# Patient Record
Sex: Female | Born: 1985 | Race: White | Hispanic: No | Marital: Married | Smoking: Never smoker
Health system: Southern US, Community
[De-identification: ages and names within clinical notes are randomized; demographics above are authoritative.]

## PROBLEM LIST (undated history)

## (undated) DIAGNOSIS — IMO0001 Reserved for inherently not codable concepts without codable children: Secondary | ICD-10-CM

## (undated) DIAGNOSIS — R519 Headache, unspecified: Secondary | ICD-10-CM

## (undated) DIAGNOSIS — F419 Anxiety disorder, unspecified: Secondary | ICD-10-CM

## (undated) DIAGNOSIS — R87629 Unspecified abnormal cytological findings in specimens from vagina: Secondary | ICD-10-CM

## (undated) DIAGNOSIS — R51 Headache: Secondary | ICD-10-CM

## (undated) HISTORY — DX: Headache: R51

## (undated) HISTORY — DX: Unspecified abnormal cytological findings in specimens from vagina: R87.629

## (undated) HISTORY — DX: Anxiety disorder, unspecified: F41.9

## (undated) HISTORY — DX: Headache, unspecified: R51.9

## (undated) HISTORY — DX: Reserved for inherently not codable concepts without codable children: IMO0001

---

## 2007-02-02 ENCOUNTER — Other Ambulatory Visit: Payer: Self-pay

## 2007-02-02 ENCOUNTER — Emergency Department: Payer: Self-pay

## 2007-10-18 ENCOUNTER — Ambulatory Visit: Payer: Self-pay | Admitting: Obstetrics and Gynecology

## 2012-03-31 ENCOUNTER — Encounter: Payer: Self-pay | Admitting: Internal Medicine

## 2012-03-31 ENCOUNTER — Ambulatory Visit (INDEPENDENT_AMBULATORY_CARE_PROVIDER_SITE_OTHER): Payer: 59 | Admitting: Internal Medicine

## 2012-03-31 VITALS — BP 110/68 | HR 68 | Temp 98.9°F | Resp 16 | Ht 66.0 in | Wt 192.5 lb

## 2012-03-31 DIAGNOSIS — R21 Rash and other nonspecific skin eruption: Secondary | ICD-10-CM

## 2012-03-31 DIAGNOSIS — L564 Polymorphous light eruption: Secondary | ICD-10-CM | POA: Insufficient documentation

## 2012-03-31 DIAGNOSIS — L568 Other specified acute skin changes due to ultraviolet radiation: Secondary | ICD-10-CM

## 2012-03-31 MED ORDER — ETONOGESTREL-ETHINYL ESTRADIOL 0.12-0.015 MG/24HR VA RING: VAGINAL_RING | VAGINAL | Status: DC

## 2012-03-31 MED ORDER — BETAMETHASONE DIPROPIONATE AUG 0.05 % EX CREA: TOPICAL_CREAM | Freq: Two times a day (BID) | CUTANEOUS | Status: DC

## 2012-03-31 NOTE — Patient Instructions (Addendum)
Your rash appears to be a polymorphic light eruption due to sun exposure.  I have given you a steroid injection and am prescribing a high potency steroid cream to apply twice daily for the next week.

## 2012-03-31 NOTE — Progress Notes (Signed)
Patient ID: Kendra Ramos, female   DOB: [birth year], 26 y.o.   MRN: [id number] Patient Active Problem List  Diagnosis  . Polymorphic light eruption    Subjective:  CC:   Chief Complaint  Patient presents with  . Rash    HPI:   Kendra Ramos is a 26 y.o. female who presents as a new patient to establish primary care with the chief complaint of   Pruritic skin rash affecting arm and leg on right side of body as well as entire upper back.  The rash has been present for about one to 2 weeks it has progressed. She states that the rash startsoff with pink papules ,  which become intensely pruritic buteventually resolve leaving hypopigmented areas.  She has been taking benadryl and tried an Metallurgist /colloidal bath last night for the intense itching. No significant change.  Has not changed detergents,  Moisturizers but has started  A supplement containing  Magnolia and phelodendron called Relora.  She has regular signs blood her..  Had  2 full days of right sided sun exposure last weekend and a prior trip to the beach the first week of August.  She has also used a tanning bed but not in the past 2 weeks.    she was recently evaluated urgent care for right foot was found to be dehydrated after working out at gym.  seen by Urgent Care and cmet cbc drawn.     History reviewed. No pertinent past medical history.  History reviewed. No pertinent past surgical history.  Family History  Problem Relation Age of Onset  . Diabetes Mother   . Cancer Mother     skin ca  . Diabetes Father   . Diabetes Maternal Grandmother   . Diabetes Maternal Grandfather   . Diabetes Paternal Grandmother   . Diabetes Paternal Grandfather     History   Social History  . Marital Status: Married    Spouse Name: N/A    Number of Children: N/A  . Years of Education: N/A   Occupational History  . Not on file.   Social History Main Topics  . Smoking status: Never Smoker   . Smokeless tobacco: Never Used  .  Alcohol Use: 3.5 oz/week    7 drink(s) per week  . Drug Use: No  . Sexually Active: Not on file   Other Topics Concern  . Not on file   Social History Narrative  . No narrative on file   No Known Allergies    Review of Systems:   The remainder of the review of systems was negative except those addressed in the HPI.     Objective:  BP 110/68  Pulse 68  Temp 98.9 F (37.2 C) (Oral)  Resp 16  Ht 5\' 6"  (1.676 m)  Wt 192 lb 8 oz (87.317 kg)  BMI 31.07 kg/m2  SpO2 98%  LMP 02/29/2012  General appearance: alert, cooperative and appears stated age Ears: normal TM's and external ear canals both ears Throat: lips, mucosa, and tongue normal; teeth and gums normal Neck: no adenopathy, no carotid bruit, supple, symmetrical, trachea midline and thyroid not enlarged, symmetric, no tenderness/mass/nodules Back: symmetric, no curvature. ROM normal. No CVA tenderness. Lungs: clear to auscultation bilaterally Heart: regular rate and rhythm, S1, S2 normal, no murmur, click, rub or gallop Abdomen: soft, non-tender; bowel sounds normal; no masses,  no organomegaly Pulses: 2+ and symmetric Skin: Skin color, texture, turgor normal. No rashes or lesions Lymph nodes: Cervical, supraclavicular, and  axillary nodes normal.  Assessment and Plan:  Polymorphic light eruption Versus drug reaction to several supplements that she has been taking. However no prior workup for autoimmune/rheumatologic dose disorders has been done. Serologies were sent off today. I recommended that she continue to suspend her supplements and try a steroid taper which was started today with a intramuscular injection of Depo-Medrol. If symptoms improve and resolve she can use resume her supplements for the time to see if symptoms return.   Updated Medication List Outpatient Encounter Prescriptions as of 03/31/2012  Medication Sig Dispense Refill  . Ascorbic Acid (VITAMIN C) 1000 MG tablet Take 2,000 mg by mouth daily.       Marland Kitchen augmented betamethasone dipropionate (DIPROLENE AF) 0.05 % cream Apply topically 2 (two) times daily.  50 g  1  . Biotin 10 MG CAPS Take by mouth.      . etonogestrel-ethinyl estradiol (NUVARING) 0.12-0.015 MG/24HR vaginal ring Insert vaginally and leave in place for 3 consecutive weeks, then remove for 1 week.  1 each  12  . fish oil-omega-3 fatty acids 1000 MG capsule Take 2 g by mouth daily.      Chilton Si Coffee Bean-Yerba Mate (GREEN COFFEE BEAN EXTRACT PO) Take by mouth.      . loratadine (CLARITIN) 10 MG tablet Take 10 mg by mouth daily.      . NON FORMULARY Relora      . vitamin B-12 (CYANOCOBALAMIN) 100 MCG tablet Take 50 mcg by mouth daily.         Orders Placed This Encounter  Procedures  . Sedimentation rate  . C-reactive protein  . ANA    No Follow-up on file.

## 2012-04-02 NOTE — Assessment & Plan Note (Signed)
Versus drug reaction to several supplements that she has been taking. I recommended that she continue to suspend her supplements and try a steroid taper which was started today with a intramuscular injection of Depo-Medrol. If symptoms improve and resolve she can use resume her supplements for the time to see if symptoms return.

## 2012-06-08 ENCOUNTER — Other Ambulatory Visit: Payer: Self-pay | Admitting: Internal Medicine

## 2012-06-08 DIAGNOSIS — M7750 Other enthesopathy of unspecified foot: Secondary | ICD-10-CM

## 2012-08-30 ENCOUNTER — Other Ambulatory Visit: Payer: Self-pay | Admitting: Internal Medicine

## 2012-08-30 MED ORDER — METHOCARBAMOL 500 MG PO TABS: 500.0000 mg | ORAL_TABLET | Freq: Four times a day (QID) | ORAL | Status: DC

## 2012-08-30 MED ORDER — TRAMADOL HCL 50 MG PO TABS
50.0000 mg | ORAL_TABLET | Freq: Three times a day (TID) | ORAL | Status: DC | PRN
Start: 2012-08-30 — End: 2012-09-29

## 2012-09-18 ENCOUNTER — Other Ambulatory Visit: Payer: Self-pay | Admitting: Internal Medicine

## 2012-09-22 ENCOUNTER — Other Ambulatory Visit: Payer: Self-pay | Admitting: Internal Medicine

## 2012-09-22 MED ORDER — HYDROCODONE-HOMATROPINE 5-1.5 MG/5ML PO SYRP: 5.0000 mL | ORAL_SOLUTION | ORAL | Status: DC | PRN

## 2012-09-29 ENCOUNTER — Ambulatory Visit (INDEPENDENT_AMBULATORY_CARE_PROVIDER_SITE_OTHER): Payer: 59 | Admitting: Internal Medicine

## 2012-09-29 ENCOUNTER — Encounter: Payer: Self-pay | Admitting: Internal Medicine

## 2012-09-29 VITALS — BP 110/70 | HR 84 | Temp 98.5°F | Resp 12 | Ht 66.0 in | Wt 203.0 lb

## 2012-09-29 DIAGNOSIS — J209 Acute bronchitis, unspecified: Secondary | ICD-10-CM | POA: Insufficient documentation

## 2012-09-29 MED ORDER — AZITHROMYCIN 500 MG PO TABS: 500.0000 mg | ORAL_TABLET | Freq: Every day | ORAL | Status: DC

## 2012-09-29 MED ORDER — HYDROCODONE-HOMATROPINE 5-1.5 MG/5ML PO SYRP: 5.0000 mL | ORAL_SOLUTION | Freq: Four times a day (QID) | ORAL | Status: DC | PRN

## 2012-09-29 MED ORDER — PREDNISONE (PAK) 10 MG PO TABS: ORAL_TABLET | ORAL | Status: DC

## 2012-09-29 NOTE — Patient Instructions (Addendum)
Will send antibiotic once I see your strep test ,   Pertussis test to be drawn today at [facility] the prednisone with the antibiotic ( 6 days pre prednisone,  7 of abx)  If strep is positive,  You will need to be out of work for 48 hrs

## 2012-10-01 NOTE — Progress Notes (Signed)
Patient ID: Kendra Ramos, female   DOB: 1985/09/13, 27 y.o.   MRN: [id number]   Patient Active Problem List  Diagnosis  . Polymorphic light eruption  . Acute bronchitis    Subjective:  CC:   Chief Complaint  Patient presents with  . Sinusitis    HPI:   Kendra Ramos a 27 y.o. female who presents Sinus pressure,  purulent drainage accompanied by laryngitis and persistent cough. Patient's symptoms started approximately 10 days ago with a sore throat low-grade fevers and congestion. She has no history of high fevers or myalgias and had had her flu shot. She will was instructed to treat her symptoms with decongestants and prescribed a cough medicine. She went on a cruise that have been previous as scheduled and states that the symptoms progressed and she was very uncomfortable and a cruise with continued cough and sinus congestion and lost her voice about 3 days ago.   No past medical history on file.  No past surgical history on file.     The following portions of the patient's history were reviewed and updated as appropriate: Allergies, current medications, and problem list.    Review of Systems:   12 Pt  review of systems was negative except those addressed in the HPI,     History   Social History  . Marital Status: Married    Spouse Name: N/A    Number of Children: N/A  . Years of Education: N/A   Occupational History  . Not on file.   Social History Main Topics  . Smoking status: Never Smoker   . Smokeless tobacco: Never Used  . Alcohol Use: 3.5 oz/week    7 drink(s) per week  . Drug Use: No  . Sexually Active: Not on file   Other Topics Concern  . Not on file   Social History Narrative  . No narrative on file    Objective:  BP 110/70  Pulse 84  Temp(Src) 98.5 F (36.9 C) (Oral)  Resp 12  Ht 5\' 6"  (1.676 m)  Wt 203 lb (92.08 kg)  BMI 32.78 kg/m2  General appearance: alert, cooperative and appears stated age Ears: normal TM's and  external ear canals both ears Throat: lips, mucosa, and tongue normal; teeth and gums normal Neck: no adenopathy, no carotid bruit, supple, symmetrical, trachea midline and thyroid not enlarged, symmetric, no tenderness/mass/nodules Back: symmetric, no curvature. ROM normal. No CVA tenderness. Lungs: clear to auscultation bilaterally Heart: regular rate and rhythm, S1, S2 normal, no murmur, click, rub or gallop Abdomen: soft, non-tender; bowel sounds normal; no masses,  no organomegaly Pulses: 2+ and symmetric Skin: Skin color, texture, turgor normal. No rashes or lesions Lymph nodes: Cervical, supraclavicular, and axillary nodes normal.  Assessment and Plan:  Acute bronchitis Her HEENT exam was normal except for tonsillar erythema.  throat was swabbed n and rapid strep te and in a the he saw a goal of a will and a in her or or and a will and a to her a PA and cough a to and is a will and a will and he is in no and a will and a will and he is is a for her is a well for a will and a st was negative. Will treat with acute bronchitis with azithromycin x7 days, prednisone taper and refill cough suppressant.Pertussis serology sent.    Updated Medication List Outpatient Encounter Prescriptions as of 09/29/2012  Medication Sig Dispense Refill  . Ascorbic Acid (VITAMIN C) 1000  MG tablet Take 2,000 mg by mouth daily.      . Biotin 10 MG CAPS Take by mouth.      . etonogestrel-ethinyl estradiol (NUVARING) 0.12-0.015 MG/24HR vaginal ring Insert vaginally and leave in place for 3 consecutive weeks, then remove for 1 week.  1 each  12  . fish oil-omega-3 fatty acids 1000 MG capsule Take 2 g by mouth daily.      Marland Kitchen loratadine (CLARITIN) 10 MG tablet Take 10 mg by mouth daily.      . NON FORMULARY Relora      . vitamin B-12 (CYANOCOBALAMIN) 100 MCG tablet Take 50 mcg by mouth daily.      Marland Kitchen azithromycin (ZITHROMAX) 500 MG tablet Take 1 tablet (500 mg total) by mouth daily.  7 tablet  0  .  HYDROcodone-homatropine (HYCODAN) 5-1.5 MG/5ML syrup Take 5 mLs by mouth every 6 (six) hours as needed for cough.  240 mL  0  . predniSONE (STERAPRED UNI-PAK) 10 MG tablet 6 tablets on Day 1 , then reduce by 1 tablet daily until gone  21 tablet  0  . [DISCONTINUED] augmented betamethasone dipropionate (DIPROLENE AF) 0.05 % cream Apply topically 2 (two) times daily.  50 g  1  . [DISCONTINUED] Chilton Si Coffee Bean-Yerba Mate (GREEN COFFEE BEAN EXTRACT PO) Take by mouth.      . [DISCONTINUED] HYDROcodone-homatropine (HYCODAN) 5-1.5 MG/5ML syrup Take 5 mLs by mouth every 4 (four) hours as needed for cough.  180 mL  0  . [DISCONTINUED] methocarbamol (ROBAXIN) 500 MG tablet Take 1 tablet (500 mg total) by mouth 4 (four) times daily.  30 tablet  1  . [DISCONTINUED] traMADol (ULTRAM) 50 MG tablet Take 1 tablet (50 mg total) by mouth every 8 (eight) hours as needed for pain.  60 tablet  2   No facility-administered encounter medications on file as of 09/29/2012.     Orders Placed This Encounter  Procedures  . B pertussis IgG/IgM Ab  . POCT rapid strep A    No Follow-up on file.

## 2012-10-01 NOTE — Assessment & Plan Note (Addendum)
Her HEENT exam was normal except for tonsiallr erythema.  throat was swabbed nad strep tst was negative. Will treat with acute bronchitis with azithromycin x7 days, prednisone taper and refill cough suppressant.Pertussis serology sent.

## 2012-10-03 ENCOUNTER — Other Ambulatory Visit: Payer: Self-pay | Admitting: Internal Medicine

## 2012-10-03 MED ORDER — FLUCONAZOLE 150 MG PO TABS: 150.0000 mg | ORAL_TABLET | Freq: Once | ORAL | Status: DC

## 2012-10-04 ENCOUNTER — Telehealth: Payer: Self-pay | Admitting: Internal Medicine

## 2012-10-04 NOTE — Telephone Encounter (Signed)
Results called to patient with verbalization of understanding.

## 2012-10-04 NOTE — Telephone Encounter (Signed)
Her test for pertussis was negative for infection,  Positive for prior vaccination

## 2012-10-16 ENCOUNTER — Other Ambulatory Visit: Payer: Self-pay | Admitting: Internal Medicine

## 2012-10-16 MED ORDER — MOMETASONE FUROATE 50 MCG/ACT NA SUSP: NASAL | Status: DC

## 2012-10-20 ENCOUNTER — Encounter: Payer: Self-pay | Admitting: Internal Medicine

## 2012-12-09 ENCOUNTER — Ambulatory Visit: Payer: Self-pay | Admitting: Emergency Medicine

## 2012-12-27 ENCOUNTER — Telehealth: Payer: Self-pay | Admitting: Internal Medicine

## 2012-12-27 MED ORDER — OMEPRAZOLE 40 MG PO CPDR: 40.0000 mg | DELAYED_RELEASE_CAPSULE | Freq: Every day | ORAL | Status: DC

## 2012-12-27 MED ORDER — METHOCARBAMOL 500 MG PO TABS: 500.0000 mg | ORAL_TABLET | Freq: Four times a day (QID) | ORAL | Status: DC

## 2012-12-27 NOTE — Telephone Encounter (Signed)
Patient called, told to take robaxin, tylenol and ibuprofen.

## 2012-12-27 NOTE — Telephone Encounter (Signed)
Patient Information:  Caller Name: Tuwanda  Phone: [phone]  Patient: Kendra Ramos, Kendra Ramos  Gender: Female  DOB: August 20, 1985  Age: 27 Years  PCP: Duncan Dull (Adults only)  Pregnant: No  Office Follow Up:  Does the office need to follow up with this patient?: Yes  Instructions For The Office: Has go to office now disposition due to severe back pain- No appointments available   Symptoms  Reason For Call & Symptoms: Huntley states she had back problems over the last year. States she had onset of right upper back at "shoulder blade/back fat  " area on 12/23/12. New onset of back pain was not due to heavy lifting or strenuous activity. Has taken 12  Ibuprofen 200 mg since 0400 on 12/27/12. Rates pain from 4 to 7 on 1/10 pt scale. Having to use left  arm and hand due to right sided back pain. Per back pain protocol has go to office now protocol due to severe back pain. No appointments left  in [facility] for 12/27/12. Please call Lorah at [phone] for work in appointment.  Reviewed Health History In EMR: Yes  Reviewed Medications In EMR: Yes  Reviewed Allergies In EMR: No  Reviewed Surgeries / Procedures: Yes  Date of Onset of Symptoms: 12/23/2012  Treatments Tried: Ibuprofen  Treatments Tried Worked: No OB / GYN:  LMP: 12/06/2012  Guideline(s) Used:  Back Pain  Disposition Per Guideline:   Go to Office Now  Reason For Disposition Reached:   Severe back pain  Advice Given:  Call Back If:  Numbness or weakness occur  Bowel/bladder problems occur  Pain lasts for more than 2 weeks  You become worse.  Reassurance:  Twisting or heavy lifting can cause back pain.  With treatment, the pain most often goes away in 1-2 weeks.  You can treat most back pain at home.  Cold or Heat:  Cold Pack: For pain or swelling, use a cold pack or ice wrapped in a wet cloth. Put it on the sore area for 20 minutes. Repeat 4 times on the first day, then as needed.  Heat Pack: If pain lasts over 2 days,  apply heat to the sore area. Use a heat pack, heating pad, or warm wet washcloth. Do this for 10 minutes, then as needed. For widespread stiffness, take a hot bath or hot shower instead. Move the sore area under the warm water.  Sleep:  Sleep on your side with a pillow between your knees. If you sleep on your back, put a pillow under your knees.  Avoid sleeping on your stomach.  Your mattress should be firm. Avoid waterbeds.  Pain Medicines:  For pain relief, take acetaminophen, ibuprofen, or naproxen.  Call Back If:  Numbness or weakness occur  Bowel/bladder problems occur  Pain lasts for more than 2 weeks  You become worse.  Patient Will Follow Care Advice:  YES

## 2012-12-28 ENCOUNTER — Other Ambulatory Visit: Payer: Self-pay | Admitting: Internal Medicine

## 2012-12-28 MED ORDER — HYDROCODONE-ACETAMINOPHEN 5-325 MG PO TABS: 1.0000 | ORAL_TABLET | Freq: Four times a day (QID) | ORAL | Status: DC | PRN

## 2013-03-01 ENCOUNTER — Other Ambulatory Visit: Payer: Self-pay | Admitting: Internal Medicine

## 2013-03-01 DIAGNOSIS — F411 Generalized anxiety disorder: Secondary | ICD-10-CM

## 2013-03-01 MED ORDER — CLONAZEPAM 0.5 MG PO TABS: 0.5000 mg | ORAL_TABLET | Freq: Two times a day (BID) | ORAL | Status: DC | PRN

## 2013-03-01 NOTE — Progress Notes (Signed)
Patient scheduled appointment for 03/20/13. Script faxed

## 2013-03-20 ENCOUNTER — Encounter: Payer: Self-pay | Admitting: Internal Medicine

## 2013-03-20 ENCOUNTER — Ambulatory Visit (INDEPENDENT_AMBULATORY_CARE_PROVIDER_SITE_OTHER): Payer: 59 | Admitting: Internal Medicine

## 2013-03-20 VITALS — BP 118/70 | HR 72 | Temp 98.3°F | Resp 14 | Wt 205.5 lb

## 2013-03-20 DIAGNOSIS — E669 Obesity, unspecified: Secondary | ICD-10-CM

## 2013-03-20 DIAGNOSIS — N930 Postcoital and contact bleeding: Secondary | ICD-10-CM

## 2013-03-20 DIAGNOSIS — F411 Generalized anxiety disorder: Secondary | ICD-10-CM

## 2013-03-20 DIAGNOSIS — R87619 Unspecified abnormal cytological findings in specimens from cervix uteri: Secondary | ICD-10-CM

## 2013-03-20 MED ORDER — CLONAZEPAM 0.5 MG PO TABS: 0.5000 mg | ORAL_TABLET | Freq: Two times a day (BID) | ORAL | Status: DC | PRN

## 2013-03-20 NOTE — Progress Notes (Signed)
Patient ID: Kendra Ramos, female   DOB: 1985/10/04, 27 y.o.   MRN: [id number]     Patient Active Problem List   Diagnosis Date Noted  . Abnormal Pap smear of cervix 03/21/2013  . Obesity, unspecified 03/21/2013  . PCB (post coital bleeding) 03/20/2013  . Generalized anxiety disorder 03/01/2013    Subjective:  CC:   Chief Complaint  Patient presents with  . Follow-up    for medication    HPI:   Kendra Ramos a 27 y.o. female who presents Followup on recent development of job-related anxiety. Patient is a Designer, multimedia and has been under increasing levels of stress due to lack of support from her immediate  Supervisor.  She manages a group of a employees who are not performing well and she is continually rebuffed by her immediate supervisor when she tries to give them accurate assessments other works quality. She was recently called while on vacation by her immediate supervisor to manage a work issue which should have been handled by the supervisor who was on call. She feels that her supervisor does not respect boundaries. She wakes up in the morning tripping going to work at times. She feels better when she is away from work. She has been using a low dose of clonazepam to help her manage her anxiety she feels at work. She has not use it more than once daily. She does not combine it with alcohol. She feels that the clonazepam has been working well for her. She does not want to try a daily SSRI due to concern about weight gain and due to her identification of specific precipitants.  2) weight gain. Patient has been struggling with her weight for several years. She is working out on the regular basis with a Psychologist, educational. She has limited success with her current diet she is on an is requesting help.     3)  post coital bleeding. Patient has a history of abnormal Pap smears and has been noticing light bleeding during and for 24 hours after sexual intercourse. Intercourse is not  painful. She does use Astra glide for extra lubrication .    History reviewed. No pertinent past medical history.  No past surgical history on file.     The following portions of the patient's history were reviewed and updated as appropriate: Allergies, current medications, and problem list.    Review of Systems:   12 Pt  review of systems was negative except those addressed in the HPI,     History   Social History  . Marital Status: Married    Spouse Name: N/A    Number of Children: N/A  . Years of Education: N/A   Occupational History  . Not on file.   Social History Main Topics  . Smoking status: Never Smoker   . Smokeless tobacco: Never Used  . Alcohol Use: 3.5 oz/week    7 drink(s) per week  . Drug Use: No  . Sexual Activity: Not on file   Other Topics Concern  . Not on file   Social History Narrative  . No narrative on file    Objective:  Filed Vitals:   03/20/13 1444  BP: 118/70  Pulse: 72  Temp: 98.3 F (36.8 C)  Resp: 14     General appearance: alert, cooperative and appears stated age Ears: normal TM's and external ear canals both ears Throat: lips, mucosa, and tongue normal; teeth and gums normal Neck: no adenopathy, no carotid bruit, supple,  symmetrical, trachea midline and thyroid not enlarged, symmetric, no tenderness/mass/nodules Back: symmetric, no curvature. ROM normal. No CVA tenderness. Lungs: clear to auscultation bilaterally Heart: regular rate and rhythm, S1, S2 normal, no murmur, click, rub or gallop Abdomen: soft, non-tender; bowel sounds normal; no masses,  no organomegaly Pulses: 2+ and symmetric Skin: Skin color, texture, turgor normal. No rashes or lesions Lymph nodes: Cervical, supraclavicular, and axillary nodes normal.  Assessment and Plan:  Generalized anxiety disorder She is tolerating the clonazepam well and has not escalated her dose at all. Her pressures are related to work stressors. Spent 20 minutes  discussing her work environment and whether there are available HR personnel that she can relay her concerns.     PCB (post coital bleeding) Given her history of abnormal Pap smears in concerned that she has cervical dysplasia and may need:further evaluation . Patient is currently seeing Dr. Lysle Morales for gynecologic care. I am advocating for seconds opinion from Dr. Greggory Keen  Obesity, unspecified I have addressed  BMI and recommended a low glycemic index diet utilizing smaller more frequent meals to increase metabolism. She is exercising well and adequately.    Updated Medication List Outpatient Encounter Prescriptions as of 03/20/2013  Medication Sig Dispense Refill  . Ascorbic Acid (VITAMIN C) 1000 MG tablet Take 2,000 mg by mouth daily.      . Biotin 10 MG CAPS Take by mouth.      . clonazePAM (KLONOPIN) 0.5 MG tablet Take 1 tablet (0.5 mg total) by mouth 2 (two) times daily as needed for anxiety.  90 tablet  3  . diphenoxylate-atropine (LOMOTIL) 2.5-0.025 MG per tablet Take 1 tablet by mouth daily.      Marland Kitchen etonogestrel-ethinyl estradiol (NUVARING) 0.12-0.015 MG/24HR vaginal ring Insert vaginally and leave in place for 3 consecutive weeks, then remove for 1 week.  1 each  12  . fish oil-omega-3 fatty acids 1000 MG capsule Take 2 g by mouth daily.      Marland Kitchen HYDROcodone-acetaminophen (NORCO/VICODIN) 5-325 MG per tablet Take 1 tablet by mouth every 6 (six) hours as needed for pain.  30 tablet  3  . loratadine (CLARITIN) 10 MG tablet Take 10 mg by mouth daily.      . mometasone (NASONEX) 50 MCG/ACT nasal spray 2 sprays into each nostril once daily  17 g  12  . vitamin B-12 (CYANOCOBALAMIN) 100 MCG tablet Take 50 mcg by mouth daily.      . [DISCONTINUED] clonazePAM (KLONOPIN) 0.5 MG tablet Take 1 tablet (0.5 mg total) by mouth 2 (two) times daily as needed for anxiety.  30 tablet  1  . [DISCONTINUED] clonazePAM (KLONOPIN) 0.5 MG tablet Take 1 tablet (0.5 mg total) by mouth 2 (two) times daily as  needed for anxiety.  30 tablet  3  . [DISCONTINUED] azithromycin (ZITHROMAX) 500 MG tablet Take 1 tablet (500 mg total) by mouth daily.  7 tablet  0  . [DISCONTINUED] fluconazole (DIFLUCAN) 150 MG tablet Take 1 tablet (150 mg total) by mouth once.  2 tablet  0  . [DISCONTINUED] HYDROcodone-homatropine (HYCODAN) 5-1.5 MG/5ML syrup Take 5 mLs by mouth every 6 (six) hours as needed for cough.  240 mL  0  . [DISCONTINUED] methocarbamol (ROBAXIN) 500 MG tablet Take 1 tablet (500 mg total) by mouth 4 (four) times daily.  30 tablet  1  . [DISCONTINUED] NON FORMULARY Relora      . [DISCONTINUED] omeprazole (PRILOSEC) 40 MG capsule Take 1 capsule (40 mg total) by mouth daily.  30 capsule  3  . [DISCONTINUED] predniSONE (STERAPRED UNI-PAK) 10 MG tablet 6 tablets on Day 1 , then reduce by 1 tablet daily until gone  21 tablet  0   No facility-administered encounter medications on file as of 03/20/2013.     Orders Placed This Encounter  Procedures  . Ambulatory referral to Obstetrics / Gynecology    No Follow-up on file.

## 2013-03-20 NOTE — Patient Instructions (Addendum)

## 2013-03-21 ENCOUNTER — Encounter: Payer: Self-pay | Admitting: Internal Medicine

## 2013-03-21 DIAGNOSIS — E669 Obesity, unspecified: Secondary | ICD-10-CM | POA: Insufficient documentation

## 2013-03-21 DIAGNOSIS — R87619 Unspecified abnormal cytological findings in specimens from cervix uteri: Secondary | ICD-10-CM | POA: Insufficient documentation

## 2013-03-21 NOTE — Assessment & Plan Note (Signed)
Given her history of abnormal Pap smears in concerned that she has cervical dysplasia and may need:further evaluation . Patient is currently seeing Dr. Lysle Morales for gynecologic care. I am advocating for seconds opinion from Dr. Greggory Keen

## 2013-03-21 NOTE — Assessment & Plan Note (Signed)
I have addressed  BMI and recommended a low glycemic index diet utilizing smaller more frequent meals to increase metabolism. She is exercising well and adequately.

## 2013-03-21 NOTE — Assessment & Plan Note (Signed)
She is tolerating the clonazepam well and has not escalated her dose at all. Her pressures are related to work stressors. Spent 20 minutes discussing her work environment and whether there are available HR personnel that she can relay her concerns.

## 2013-04-26 ENCOUNTER — Other Ambulatory Visit: Payer: Self-pay | Admitting: Internal Medicine

## 2013-04-26 ENCOUNTER — Telehealth: Payer: Self-pay | Admitting: Internal Medicine

## 2013-04-26 DIAGNOSIS — J019 Acute sinusitis, unspecified: Secondary | ICD-10-CM | POA: Insufficient documentation

## 2013-04-26 MED ORDER — PREDNISONE (PAK) 10 MG PO TABS: ORAL_TABLET | ORAL | Status: DC

## 2013-04-26 MED ORDER — AMOXICILLIN-POT CLAVULANATE 875-125 MG PO TABS: 1.0000 | ORAL_TABLET | Freq: Two times a day (BID) | ORAL | Status: DC

## 2013-04-26 NOTE — Assessment & Plan Note (Signed)
Hay allergy caused prolonged congestion

## 2013-04-26 NOTE — Telephone Encounter (Signed)
Please call [facility] and ask them to fill the prednisone ASAP and augmentin ,.  The others were sent in error to her mail order pharamacy

## 2013-04-26 NOTE — Telephone Encounter (Signed)
Called pharmacy confirmed scripts are there. Patient aware.

## 2013-07-03 ENCOUNTER — Encounter: Payer: Self-pay | Admitting: Internal Medicine

## 2013-07-03 ENCOUNTER — Other Ambulatory Visit: Payer: Self-pay | Admitting: Internal Medicine

## 2013-07-03 DIAGNOSIS — Z3201 Encounter for pregnancy test, result positive: Secondary | ICD-10-CM

## 2013-07-04 ENCOUNTER — Other Ambulatory Visit: Payer: Self-pay | Admitting: Internal Medicine

## 2013-07-05 LAB — COMPREHENSIVE METABOLIC PANEL
ALT: 64 IU/L — ABNORMAL HIGH (ref 0–32)
AST: 40 IU/L (ref 0–40)
Albumin/Globulin Ratio: 2.2 (ref 1.1–2.5)
Albumin: 4.4 g/dL (ref 3.5–5.5)
Calcium: 9.3 mg/dL (ref 8.7–10.2)
Chloride: 103 mmol/L (ref 97–108)
GFR calc Af Amer: 127 mL/min/{1.73_m2} (ref >59)
GFR calc non Af Amer: 110 mL/min/{1.73_m2} (ref >59)
Glucose: 97 mg/dL (ref 65–99)
Potassium: 4 mmol/L (ref 3.5–5.2)
Total Protein: 6.4 g/dL (ref 6.0–8.5)

## 2013-07-05 LAB — CBC WITH DIFFERENTIAL
Basophils Absolute: 0 10*3/uL (ref 0.0–0.2)
Eos: 2 %
Eosinophils Absolute: 0.1 10*3/uL (ref 0.0–0.4)
HCT: 38.7 % (ref 34.0–46.6)
Immature Grans (Abs): 0 10*3/uL (ref 0.0–0.1)
MCH: 29.7 pg (ref 26.6–33.0)
MCV: 89 fL (ref 79–97)
Platelets: 293 10*3/uL (ref 150–379)
RBC: 4.37 x10E6/uL (ref 3.77–5.28)
RDW: 13.3 % (ref 12.3–15.4)

## 2013-07-05 LAB — T4: T4, Total: 8.7 ug/dL (ref 4.5–12.0)

## 2013-07-05 LAB — HCG, SERUM, QUALITATIVE: hCG,Beta Subunit,Qual,Serum: POSITIVE m[IU]/mL — AB (ref <6)

## 2013-07-05 LAB — LIPID PANEL W/O CHOL/HDL RATIO
Cholesterol, Total: 154 mg/dL (ref 100–199)
HDL: 78 mg/dL (ref >39)
LDL Calculated: 66 mg/dL (ref 0–99)
VLDL Cholesterol Cal: 10 mg/dL (ref 5–40)

## 2013-07-05 LAB — T3 UPTAKE: T3 Uptake Ratio: 26 % (ref 24–39)

## 2013-07-05 LAB — PREGNANCY, URINE: Preg Test, Ur: POSITIVE — AB

## 2014-03-07 ENCOUNTER — Inpatient Hospital Stay: Payer: Self-pay

## 2014-03-07 LAB — CBC WITH DIFFERENTIAL/PLATELET
Basophil #: 0 10*3/uL (ref 0.0–0.1)
Basophil %: 0.4 %
Eosinophil #: 0.1 10*3/uL (ref 0.0–0.7)
Eosinophil %: 0.9 %
HCT: 32.9 % — ABNORMAL LOW (ref 35.0–47.0)
HGB: 10.7 g/dL — ABNORMAL LOW (ref 12.0–16.0)
Lymphocyte #: 3.2 10*3/uL (ref 1.0–3.6)
Lymphocyte %: 31.2 %
MCH: 28.4 pg (ref 26.0–34.0)
MCHC: 32.5 g/dL (ref 32.0–36.0)
MCV: 88 fL (ref 80–100)
Monocyte #: 0.7 x10 3/mm (ref 0.2–0.9)
Monocyte %: 7 %
NEUTROS PCT: 60.5 %
Neutrophil #: 6.2 10*3/uL (ref 1.4–6.5)
PLATELETS: 220 10*3/uL (ref 150–440)
RBC: 3.76 10*6/uL — ABNORMAL LOW (ref 3.80–5.20)
RDW: 13.1 % (ref 11.5–14.5)
WBC: 10.2 10*3/uL (ref 3.6–11.0)

## 2014-03-09 LAB — HEMATOCRIT: HCT: 29.7 % — ABNORMAL LOW (ref 35.0–47.0)

## 2014-12-11 NOTE — H&P (Signed)
L&D Evaluation:  History:  HPI [name] sent over from office for IOL at [redacted]w[redacted]d with oligohydramnios. prenatal course normal to date otherwise   Patient's Medical History other  IBS   Patient's Surgical History none   Medications Pre Natal Vitamins  Tylenol (Acetaminophen)  omeprazole, zantac, Fiorecet prn   Allergies NKDA   Social History none   Family History Non-Contributory   ROS:  ROS All systems were reviewed.  HEENT, CNS, GI, GU, Respiratory, CV, Renal and Musculoskeletal systems were found to be normal.   Exam:  Vital Signs stable   General no apparent distress   Mental Status clear   Chest clear   Heart normal sinus rhythm   Abdomen gravid, non-tender   Estimated Fetal Weight Average for gestational age, EFW 8# on u/s today   Fetal Position vtx   Edema 1+   Reflexes 1+   Clonus negative   Pelvic 2-3/50/-2   Mebranes Intact   FHT normal rate with no decels   Fetal Heart Rate 130   Ucx absent   Skin dry   Impression:  Impression 40w IUP with Oligohydramnios   Plan:  Plan Pitocin IOL   Electronic Signatures: Evonnie Pat (CNM)  (Signed 05-Aug-15 17:48)  Authored: L&D Evaluation   Last Updated: 05-Aug-15 17:48 by Evonnie Pat (CNM)

## 2015-01-28 ENCOUNTER — Encounter: Payer: Self-pay | Admitting: *Deleted

## 2015-01-30 ENCOUNTER — Encounter: Payer: Self-pay | Admitting: Obstetrics and Gynecology

## 2015-01-30 ENCOUNTER — Ambulatory Visit (INDEPENDENT_AMBULATORY_CARE_PROVIDER_SITE_OTHER): Payer: 59 | Admitting: Obstetrics and Gynecology

## 2015-01-30 VITALS — BP 118/83 | HR 69 | Ht 66.0 in | Wt 222.4 lb

## 2015-01-30 DIAGNOSIS — E669 Obesity, unspecified: Secondary | ICD-10-CM

## 2015-01-30 MED ORDER — PHENTERMINE HCL 37.5 MG PO TABS
37.5000 mg | ORAL_TABLET | Freq: Every day | ORAL | Status: DC
Start: 2015-01-30 — End: 2015-08-08

## 2015-01-30 MED ORDER — CYANOCOBALAMIN 1000 MCG/ML IJ SOLN
1000.0000 ug | Freq: Once | INTRAMUSCULAR | Status: AC
  Administered 2015-01-30: 1000 ug via INTRAMUSCULAR

## 2015-01-30 MED ORDER — CYANOCOBALAMIN 1000 MCG/ML IJ SOLN: 1000.0000 ug | Freq: Once | INTRAMUSCULAR | Status: DC

## 2015-01-30 NOTE — Progress Notes (Signed)
Patient ID: MIGDALIA OLEJNICZAK, female   DOB: 07/28/1986, 29 y.o.   MRN: [id number]  Chief Complaint  Patient presents with  . OTHER    weight mangement    HPI ODETH BRY is a 29 y.o. female.  Presents for weight loss management plan  HPI Desires weight loss to achieve regular BMI Past Medical History  Diagnosis Date  . Vaginal Pap smear, abnormal   . Headache   . Anxiety   . Contraception     History reviewed. No pertinent past surgical history.  Family History  Problem Relation Age of Onset  . Diabetes Mother   . Cancer Mother     skin ca  . Diabetes Father   . Diabetes Maternal Grandmother   . Diabetes Maternal Grandfather   . Diabetes Paternal Grandmother   . Diabetes Paternal Grandfather     Social History History  Substance Use Topics  . Smoking status: Never Smoker   . Smokeless tobacco: Never Used  . Alcohol Use: 3.5 oz/week    7 Standard drinks or equivalent per week    No Known Allergies  Current Outpatient Prescriptions  Medication Sig Dispense Refill  . Ascorbic Acid (VITAMIN C) 1000 MG tablet Take 2,000 mg by mouth daily.    . diphenoxylate-atropine (LOMOTIL) 2.5-0.025 MG per tablet Take 1 tablet by mouth daily.    . fish oil-omega-3 fatty acids 1000 MG capsule Take 2 g by mouth daily.    Marland Kitchen loratadine (CLARITIN) 10 MG tablet Take 10 mg by mouth daily.    . mometasone (NASONEX) 50 MCG/ACT nasal spray 2 sprays into each nostril once daily 17 g 12  . amoxicillin-clavulanate (AUGMENTIN) 875-125 MG per tablet Take 1 tablet by mouth 2 (two) times daily. (Patient not taking: Reported on 01/30/2015) 14 tablet 0  . Biotin 10 MG CAPS Take by mouth.    . clonazePAM (KLONOPIN) 0.5 MG tablet Take 1 tablet (0.5 mg total) by mouth 2 (two) times daily as needed for anxiety. (Patient not taking: Reported on 01/30/2015) 90 tablet 3  . cyanocobalamin (,VITAMIN B-12,) 1000 MCG/ML injection Inject 1 mL (1,000 mcg total) into the muscle once. 10 mL 1  .  etonogestrel-ethinyl estradiol (NUVARING) 0.12-0.015 MG/24HR vaginal ring Insert vaginally and leave in place for 3 consecutive weeks, then remove for 1 week. 1 each 12  . HYDROcodone-acetaminophen (NORCO/VICODIN) 5-325 MG per tablet Take 1 tablet by mouth every 6 (six) hours as needed for pain. (Patient not taking: Reported on 01/30/2015) 30 tablet 3  . phentermine (ADIPEX-P) 37.5 MG tablet Take 1 tablet (37.5 mg total) by mouth daily before breakfast. 30 tablet 2  . predniSONE (STERAPRED UNI-PAK) 10 MG tablet 6 tablets on Day 1 , then reduce by 1 tablet daily until gone (Patient not taking: Reported on 01/30/2015) 21 tablet 0   No current facility-administered medications for this visit.    Review of Systems Review of Systems  Blood pressure 118/83, pulse 69, height 5\' 6"  (1.676 m), weight 222 lb 6.4 oz (100.88 kg), last menstrual period 12/31/2014.  Physical Exam Physical Exam   A&O x 4 Slightly tearful when talking about weight, therwise appropriate affect Well groomed obese female  Data Reviewed VS and BMI, current diet and exercise along with ADLs  Assessment    Morbid obesity     Plan    Rx for Adipex 37.5mg  daily x 12 weeks, with B12 1074mcg/ml monthly injections.  Increase exercise to 69min 4x/wk  Decrease portion size, increase protein  intake.  RTC in 4 weeks for wt/bp check and B12 injection.   End Goal weight 150#       Melody Burr 01/30/2015, 12:31 PM

## 2015-03-06 ENCOUNTER — Telehealth: Payer: Self-pay | Admitting: *Deleted

## 2015-03-07 NOTE — Telephone Encounter (Signed)
Pt came by to discuss her sore throat, due to her daughter turning 1 this weekend

## 2015-03-22 ENCOUNTER — Encounter: Payer: Self-pay | Admitting: Obstetrics and Gynecology

## 2015-03-22 ENCOUNTER — Ambulatory Visit (INDEPENDENT_AMBULATORY_CARE_PROVIDER_SITE_OTHER): Payer: 59 | Admitting: Obstetrics and Gynecology

## 2015-03-22 VITALS — BP 115/80 | HR 79 | Ht 66.0 in | Wt 212.6 lb

## 2015-03-22 DIAGNOSIS — Z01419 Encounter for gynecological examination (general) (routine) without abnormal findings: Secondary | ICD-10-CM

## 2015-03-22 MED ORDER — CLONAZEPAM 0.5 MG PO TABS: 0.5000 mg | ORAL_TABLET | Freq: Two times a day (BID) | ORAL | Status: DC | PRN

## 2015-03-22 MED ORDER — ETONOGESTREL-ETHINYL ESTRADIOL 0.12-0.015 MG/24HR VA RING: VAGINAL_RING | VAGINAL | Status: DC

## 2015-03-22 NOTE — Patient Instructions (Signed)
  Place annual gynecologic exam patient instructions here.  Thank you for enrolling in [place]. Please follow the instructions below to securely access your online medical record. MyChart allows you to send messages to your doctor, view your test results, manage appointments, and more.   How Do I Sign Up? 1. In your Internet browser, go to [employer] and enter [email or web address]. 2. Click on the Sign Up Now link in the Sign In box. You will see the New Member Sign Up page. 3. Enter your MyChart Access Code exactly as it appears below. You will not need to use this code after you've completed the sign-up process. If you do not sign up before the expiration date, you must request a new code.  MyChart Access Code: [email or web address] Expires: 03/31/2015  3:22 AM  4. Enter your Social Security Number ([id number]) and Date of Birth (mm/dd/yyyy) as indicated and click Submit. You will be taken to the next sign-up page. 5. Create a MyChart ID. This will be your MyChart login ID and cannot be changed, so think of one that is secure and easy to remember. 6. Create a MyChart password. You can change your password at any time. 7. Enter your Password Reset Question and Answer. This can be used at a later time if you forget your password.  8. Enter your e-mail address. You will receive e-mail notification when new information is available in [place]. 9. Click Sign Up. You can now view your medical record.   Additional Information Remember, MyChart is NOT to be used for urgent needs. For medical emergencies, dial 911.

## 2015-03-22 NOTE — Progress Notes (Signed)
  Subjective:     Kendra Ramos is a 29 y.o. female and is here for a comprehensive physical exam. The patient reports slight increased anxiety last two months and occasional reflux.  Social History   Social History  . Marital Status: Married    Spouse Name: N/A  . Number of Children: N/A  . Years of Education: N/A   Occupational History  . Not on file.   Social History Main Topics  . Smoking status: Never Smoker   . Smokeless tobacco: Never Used  . Alcohol Use: 3.5 oz/week    7 Standard drinks or equivalent per week  . Drug Use: No  . Sexual Activity: Yes    Birth Control/ Protection: Inserts   Other Topics Concern  . Not on file   Social History Narrative   Health Maintenance  Topic Date Due  . HIV Screening  01/04/1999  . TETANUS/TDAP  01/04/2003  . INFLUENZA VACCINE  03/04/2015    The following portions of the patient's history were reviewed and updated as appropriate: allergies, current medications, past family history, past medical history, past social history, past surgical history and problem list.  Review of Systems A comprehensive review of systems was negative.   Objective:    General appearance: alert, cooperative and appears stated age Neck: no adenopathy, no carotid bruit, no JVD, supple, symmetrical, trachea midline and thyroid not enlarged, symmetric, no tenderness/mass/nodules Lungs: clear to auscultation bilaterally Breasts: normal appearance, no masses or tenderness Heart: regular rate and rhythm, S1, S2 normal, no murmur, click, rub or gallop Abdomen: soft, non-tender; bowel sounds normal; no masses,  no organomegaly Pelvic: cervix normal in appearance, external genitalia normal, no adnexal masses or tenderness, no cervical motion tenderness, rectovaginal septum normal, uterus normal size, shape, and consistency and vagina normal without discharge    Assessment:    Healthy female exam. Obesity; h/o abnormal pap; reflux; anxiety; HC user       Plan:  Pap and routine screening labs obtained; Nuvaring and clonazepam refilled.    See After Visit Summary for Counseling Recommendations

## 2015-03-23 LAB — LIPID PANEL
Chol/HDL Ratio: 2.5 ratio units (ref 0.0–4.4)
Cholesterol, Total: 167 mg/dL (ref 100–199)
HDL: 66 mg/dL (ref >39)
LDL CALC: 76 mg/dL (ref 0–99)
TRIGLYCERIDES: 127 mg/dL (ref 0–149)
VLDL CHOLESTEROL CAL: 25 mg/dL (ref 5–40)

## 2015-03-23 LAB — COMPREHENSIVE METABOLIC PANEL
ALK PHOS: 50 IU/L (ref 39–117)
ALT: 23 IU/L (ref 0–32)
AST: 26 IU/L (ref 0–40)
Albumin/Globulin Ratio: 1.8 (ref 1.1–2.5)
Albumin: 4.7 g/dL (ref 3.5–5.5)
BUN/Creatinine Ratio: 17 (ref 8–20)
BUN: 12 mg/dL (ref 6–20)
Bilirubin Total: <0.2 mg/dL (ref 0.0–1.2)
CALCIUM: 9.3 mg/dL (ref 8.7–10.2)
CO2: 23 mmol/L (ref 18–29)
CREATININE: 0.71 mg/dL (ref 0.57–1.00)
Chloride: 100 mmol/L (ref 97–108)
GFR calc Af Amer: 131 mL/min/{1.73_m2} (ref >59)
GFR, EST NON AFRICAN AMERICAN: 114 mL/min/{1.73_m2} (ref >59)
GLUCOSE: 83 mg/dL (ref 65–99)
Globulin, Total: 2.6 g/dL (ref 1.5–4.5)
Potassium: 4.2 mmol/L (ref 3.5–5.2)
SODIUM: 140 mmol/L (ref 134–144)
Total Protein: 7.3 g/dL (ref 6.0–8.5)

## 2015-03-23 LAB — HEMOGLOBIN A1C
Est. average glucose Bld gHb Est-mCnc: 111 mg/dL
Hgb A1c MFr Bld: 5.5 % (ref 4.8–5.6)

## 2015-03-23 LAB — THYROID PANEL WITH TSH
FREE THYROXINE INDEX: 2.7 (ref 1.2–4.9)
T3 Uptake Ratio: 20 % — ABNORMAL LOW (ref 24–39)
T4, Total: 13.7 ug/dL — ABNORMAL HIGH (ref 4.5–12.0)
TSH: 3.05 u[IU]/mL (ref 0.450–4.500)

## 2015-03-27 LAB — PAP IG AND HPV HIGH-RISK
HPV, high-risk: NEGATIVE
PAP Smear Comment: 0

## 2015-03-29 ENCOUNTER — Ambulatory Visit (INDEPENDENT_AMBULATORY_CARE_PROVIDER_SITE_OTHER): Payer: 59 | Admitting: Obstetrics and Gynecology

## 2015-03-29 VITALS — BP 99/67 | HR 69 | Ht 66.0 in | Wt 209.2 lb

## 2015-03-29 DIAGNOSIS — E669 Obesity, unspecified: Secondary | ICD-10-CM

## 2015-03-29 MED ORDER — CYANOCOBALAMIN 1000 MCG/ML IJ SOLN
1000.0000 ug | Freq: Once | INTRAMUSCULAR | Status: AC
  Administered 2015-03-29: 1000 ug via INTRAMUSCULAR

## 2015-03-29 NOTE — Progress Notes (Cosign Needed)
Patient ID: Kendra Ramos, female   DOB: 29-Jan-1986, 29 y.o.   MRN: [id number] Wt. B/p, and b 12 inj Wt on 8/19 - 212 Down 3# S/e from adipex- none Pt c/o of waking up with sore throat. Does snore at nite. Having some nasal drainage. Advised benadryl or zyrtec. May want to see PCP.

## 2015-04-03 ENCOUNTER — Telehealth: Payer: Self-pay | Admitting: *Deleted

## 2015-04-03 NOTE — Telephone Encounter (Signed)
-----   Message from Kendra Ramos, [place] sent at 04/02/2015  4:15 PM EDT ----- Please let her know all labs including pap was normal, except T3 and T4 are a little off, but since TSH is normal, I recommend watching it and rechecking a Thyroid panel in 6 weeks.

## 2015-04-03 NOTE — Telephone Encounter (Signed)
Notified pt of results, she voiced understanding.

## 2015-04-29 ENCOUNTER — Ambulatory Visit (INDEPENDENT_AMBULATORY_CARE_PROVIDER_SITE_OTHER): Payer: 59 | Admitting: Obstetrics and Gynecology

## 2015-04-29 VITALS — BP 112/77 | HR 88 | Wt 200.8 lb

## 2015-04-29 DIAGNOSIS — E669 Obesity, unspecified: Secondary | ICD-10-CM | POA: Diagnosis not present

## 2015-04-29 MED ORDER — CYANOCOBALAMIN 1000 MCG/ML IJ SOLN
1000.0000 ug | Freq: Once | INTRAMUSCULAR | Status: AC
  Administered 2015-04-29: 1000 ug via INTRAMUSCULAR

## 2015-04-29 NOTE — Progress Notes (Cosign Needed)
Pt is here for wt, bp check, b-12 inj Pt is doing great on her weight loss and exercising  01/30/15-wt 222lb 04/29/15 wt-200

## 2015-05-09 ENCOUNTER — Telehealth: Payer: Self-pay | Admitting: Obstetrics and Gynecology

## 2015-05-09 NOTE — Telephone Encounter (Signed)
Pt is doing nuva-ring, we just changed at her last ov

## 2015-05-09 NOTE — Telephone Encounter (Signed)
Pt is spotting and then yesterday its like a full blown period.

## 2015-05-10 NOTE — Telephone Encounter (Signed)
Notified pt she voiced understanding 

## 2015-05-10 NOTE — Telephone Encounter (Signed)
Reassure her it can be normal when we change hormones the first few months, but if persists or worsens to let me know. Do not remove ring though, keep it in as prescribed.

## 2015-06-03 ENCOUNTER — Ambulatory Visit (INDEPENDENT_AMBULATORY_CARE_PROVIDER_SITE_OTHER): Payer: 59 | Admitting: Obstetrics and Gynecology

## 2015-06-03 VITALS — BP 113/77 | HR 69 | Ht 66.0 in | Wt 192.5 lb

## 2015-06-03 DIAGNOSIS — E669 Obesity, unspecified: Secondary | ICD-10-CM | POA: Diagnosis not present

## 2015-06-03 MED ORDER — CYANOCOBALAMIN 1000 MCG/ML IJ SOLN
1000.0000 ug | Freq: Once | INTRAMUSCULAR | Status: AC
  Administered 2015-06-03: 1000 ug via INTRAMUSCULAR

## 2015-06-03 NOTE — Progress Notes (Cosign Needed)
Patient ID: Kendra Ramos, female   DOB: 1986/01/28, 29 y.o.   MRN: [id number] Pt presents for weight, B/P, B-12 injection. No side effects of medication-Phentermine, or B-12.  Weight loss of 7.2 lbs. Encouraged eating healthy and exercise.

## 2015-07-02 ENCOUNTER — Ambulatory Visit: Payer: 59

## 2015-07-04 ENCOUNTER — Ambulatory Visit (INDEPENDENT_AMBULATORY_CARE_PROVIDER_SITE_OTHER): Payer: 59 | Admitting: Obstetrics and Gynecology

## 2015-07-04 VITALS — BP 117/84 | HR 83 | Wt 191.4 lb

## 2015-07-04 DIAGNOSIS — E669 Obesity, unspecified: Secondary | ICD-10-CM | POA: Diagnosis not present

## 2015-07-04 MED ORDER — CYANOCOBALAMIN 1000 MCG/ML IJ SOLN
1000.0000 ug | Freq: Once | INTRAMUSCULAR | Status: AC
  Administered 2015-07-04: 1000 ug via INTRAMUSCULAR

## 2015-07-04 NOTE — Progress Notes (Cosign Needed)
Pt is here for wt, bp check, b-12 inj Denies any s/e, she is feeling great, eating correctly and exercising daily   01/30/15 wt-222lb 03/29/15 wt-209lb

## 2015-08-01 ENCOUNTER — Ambulatory Visit: Payer: 59

## 2015-08-06 ENCOUNTER — Ambulatory Visit (INDEPENDENT_AMBULATORY_CARE_PROVIDER_SITE_OTHER): Payer: 59 | Admitting: Obstetrics and Gynecology

## 2015-08-06 VITALS — BP 109/80 | HR 82 | Wt 189.6 lb

## 2015-08-06 DIAGNOSIS — E669 Obesity, unspecified: Secondary | ICD-10-CM

## 2015-08-06 MED ORDER — CYANOCOBALAMIN 1000 MCG/ML IJ SOLN
1000.0000 ug | Freq: Once | INTRAMUSCULAR | Status: AC
  Administered 2015-08-06: 1000 ug via INTRAMUSCULAR

## 2015-08-06 NOTE — Progress Notes (Cosign Needed)
Pt is here for wt, bp check, b-12 inj Pt denies any s/e, is doing so well with her weight loss  07/04/15 wt-191 03/29/15 wt-209lb

## 2015-08-08 ENCOUNTER — Ambulatory Visit (INDEPENDENT_AMBULATORY_CARE_PROVIDER_SITE_OTHER): Payer: 59 | Admitting: Obstetrics and Gynecology

## 2015-08-08 ENCOUNTER — Encounter: Payer: Self-pay | Admitting: Obstetrics and Gynecology

## 2015-08-08 VITALS — BP 113/80 | HR 88 | Wt 189.9 lb

## 2015-08-08 DIAGNOSIS — E669 Obesity, unspecified: Secondary | ICD-10-CM

## 2015-08-08 MED ORDER — PHENTERMINE HCL 37.5 MG PO TABS: 37.5000 mg | ORAL_TABLET | Freq: Every day | ORAL | Status: DC

## 2015-08-08 MED ORDER — CYANOCOBALAMIN 1000 MCG/ML IJ SOLN: 1000.0000 ug | Freq: Once | INTRAMUSCULAR | Status: DC

## 2015-08-08 NOTE — Progress Notes (Signed)
Patient ID: Kendra Ramos, female   DOB: Jan 15, 1986, 30 y.o.   MRN: [id number] SUBJECTIVE:  30 y.o. here for follow-up weight loss visit, previously seen 4 weeks ago. Denies any concerns and feels like medication has worked well, desires continuing until BMI in normal range.  OBJECTIVE:  BP 113/80 mmHg  Pulse 88  Wt 189 lb 14.4 oz (86.138 kg)  LMP 07/27/2015  Body mass index is 30.67 kg/(m^2). Patient appears well. Has lost 33# to date ASSESSMENT:  Obesity- responding well to weight loss plan PLAN:  To continue with current medications. B12 1055mcg/ml injection given RTC in 5 weeks as planned  Melody Callao, CNM

## 2015-08-09 ENCOUNTER — Telehealth: Payer: Self-pay | Admitting: Obstetrics and Gynecology

## 2015-08-09 ENCOUNTER — Other Ambulatory Visit: Payer: Self-pay | Admitting: *Deleted

## 2015-08-09 MED ORDER — PHENTERMINE HCL 37.5 MG PO TABS: 37.5000 mg | ORAL_TABLET | Freq: Every day | ORAL | Status: DC

## 2015-08-09 NOTE — Telephone Encounter (Signed)
Faxed rx to [facility]

## 2015-08-09 NOTE — Telephone Encounter (Signed)
Patient called requesting her phentermine be sent to [facility]. She can get it much cheaper that way.Thanks

## 2015-08-14 ENCOUNTER — Other Ambulatory Visit: Payer: Self-pay | Admitting: *Deleted

## 2015-08-14 MED ORDER — PHENTERMINE HCL 37.5 MG PO TABS: 37.5000 mg | ORAL_TABLET | Freq: Every day | ORAL | Status: DC

## 2015-08-14 NOTE — Telephone Encounter (Signed)
Pt wants rx to [facility] [address] it will cheapier

## 2015-09-12 ENCOUNTER — Ambulatory Visit (INDEPENDENT_AMBULATORY_CARE_PROVIDER_SITE_OTHER): Payer: 59 | Admitting: Obstetrics and Gynecology

## 2015-09-12 VITALS — BP 102/72 | HR 75 | Wt 187.1 lb

## 2015-09-12 DIAGNOSIS — E669 Obesity, unspecified: Secondary | ICD-10-CM | POA: Diagnosis not present

## 2015-09-12 MED ORDER — CYANOCOBALAMIN 1000 MCG/ML IJ SOLN
1000.0000 ug | Freq: Once | INTRAMUSCULAR | Status: AC
  Administered 2015-09-12: 1000 ug via INTRAMUSCULAR

## 2015-09-12 NOTE — Progress Notes (Cosign Needed)
Patient ID: Kendra Ramos, female   DOB: Jan 09, 1986, 30 y.o.   MRN: [id number] Pt presents for weight, B/P, B-12 injection. No side effects of medication-Phentermine, or B-12.  Weight loss of 2 lbs. Encouraged eating healthy and exercise.

## 2015-10-10 ENCOUNTER — Ambulatory Visit (INDEPENDENT_AMBULATORY_CARE_PROVIDER_SITE_OTHER): Payer: 59 | Admitting: Obstetrics and Gynecology

## 2015-10-10 VITALS — BP 123/87 | HR 85 | Wt 184.0 lb

## 2015-10-10 DIAGNOSIS — E663 Overweight: Secondary | ICD-10-CM | POA: Diagnosis not present

## 2015-10-10 MED ORDER — CYANOCOBALAMIN 1000 MCG/ML IJ SOLN
1000.0000 ug | Freq: Once | INTRAMUSCULAR | Status: AC
  Administered 2015-10-10: 1000 ug via INTRAMUSCULAR

## 2015-10-10 NOTE — Progress Notes (Signed)
Patient ID: Kendra Ramos, female   DOB: 02-Aug-1986, 30 y.o.   MRN: [id number] Pt presents for weight, B/P, B-12 injection. No side effects of medication-Phentermine, or B-12.  Weight loss of 3 lbs. Encouraged eating healthy and exercise. Pharmacy did not have her B-12 ready so it was provided in house.

## 2015-11-07 ENCOUNTER — Ambulatory Visit (INDEPENDENT_AMBULATORY_CARE_PROVIDER_SITE_OTHER): Payer: 59 | Admitting: Obstetrics and Gynecology

## 2015-11-07 VITALS — BP 115/77 | HR 77 | Wt 183.4 lb

## 2015-11-07 DIAGNOSIS — E669 Obesity, unspecified: Secondary | ICD-10-CM | POA: Diagnosis not present

## 2015-11-07 DIAGNOSIS — J301 Allergic rhinitis due to pollen: Secondary | ICD-10-CM

## 2015-11-07 MED ORDER — MOMETASONE FUROATE 50 MCG/ACT NA SUSP: NASAL | Status: DC

## 2015-11-07 MED ORDER — CYANOCOBALAMIN 1000 MCG/ML IJ SOLN
1000.0000 ug | Freq: Once | INTRAMUSCULAR | Status: AC
  Administered 2015-11-07: 1000 ug via INTRAMUSCULAR

## 2015-11-07 MED ORDER — MONTELUKAST SODIUM 10 MG PO TABS: 10.0000 mg | ORAL_TABLET | Freq: Every day | ORAL | Status: DC

## 2015-11-07 NOTE — Progress Notes (Signed)
Pt is here for wt, bp check, b-12 inj She is doing great, working out everyday, she was slightly disappointed didn't lose this time Really encouraged her not to give up   08/08/15 wt-189 09/12/15 wt-187lb

## 2015-12-05 ENCOUNTER — Ambulatory Visit (INDEPENDENT_AMBULATORY_CARE_PROVIDER_SITE_OTHER): Payer: 59 | Admitting: Obstetrics and Gynecology

## 2015-12-05 ENCOUNTER — Other Ambulatory Visit: Payer: Self-pay | Admitting: *Deleted

## 2015-12-05 VITALS — BP 111/85 | HR 78 | Wt 183.5 lb

## 2015-12-05 DIAGNOSIS — E669 Obesity, unspecified: Secondary | ICD-10-CM

## 2015-12-05 MED ORDER — MOMETASONE FUROATE 50 MCG/ACT NA SUSP: NASAL | Status: DC

## 2015-12-05 MED ORDER — CYANOCOBALAMIN 1000 MCG/ML IJ SOLN
1000.0000 ug | Freq: Once | INTRAMUSCULAR | Status: AC
  Administered 2015-12-05: 1000 ug via INTRAMUSCULAR

## 2015-12-05 MED ORDER — MONTELUKAST SODIUM 10 MG PO TABS: 10.0000 mg | ORAL_TABLET | Freq: Every day | ORAL | Status: DC

## 2015-12-05 NOTE — Progress Notes (Signed)
Pt is here for wt, bp check, b-12  She is exercising every day, her weight stayed the same  11/07/15 wt-184 10/10/15 wt-183

## 2016-01-20 ENCOUNTER — Telehealth: Payer: Self-pay | Admitting: Obstetrics and Gynecology

## 2016-01-20 NOTE — Telephone Encounter (Signed)
Pt called and said she felt funky, wanted to know if a funky virus is going around. Dizzy, nauseated, cant concentrate, headache, trouble focusing. Melody's pt she does wt mngt

## 2016-01-20 NOTE — Telephone Encounter (Signed)
Left message for pt that she should stop phentermine at least for a few days, until she starts feeling better. Start again and if symptoms return after restarting medication, stop taking it and contact office. May be viral. Encouraged to contact office again if needed and I will forward MNS message as in she will return to office on Thursday to let her know.

## 2016-01-21 NOTE — Telephone Encounter (Signed)
Agree with plan 

## 2016-01-23 ENCOUNTER — Other Ambulatory Visit: Payer: Self-pay

## 2016-01-23 MED ORDER — MONTELUKAST SODIUM 10 MG PO TABS: 10.0000 mg | ORAL_TABLET | Freq: Every day | ORAL | Status: DC

## 2016-01-31 ENCOUNTER — Ambulatory Visit: Payer: 59 | Admitting: Obstetrics and Gynecology

## 2016-02-05 ENCOUNTER — Telehealth: Payer: Self-pay | Admitting: Obstetrics and Gynecology

## 2016-02-05 ENCOUNTER — Other Ambulatory Visit: Payer: Self-pay | Admitting: Obstetrics and Gynecology

## 2016-02-05 MED ORDER — BENZONATATE 200 MG PO CAPS: 200.0000 mg | ORAL_CAPSULE | Freq: Three times a day (TID) | ORAL | Status: DC | PRN

## 2016-02-05 NOTE — Telephone Encounter (Signed)
PATIENT CALLED STATING SHE HAS A BAD COUGH AND WENT THROUGH 3 BOTTLES OF REGULAR COUGH MEDICINE SINCE THE WEEKEND.SHE WANTED TO KNOW IF SOMETHING CAN BE CALLED IN FOR HER. SHE USES THE [facility] ON [address]. THANKS

## 2016-02-05 NOTE — Telephone Encounter (Signed)
Done-ac 

## 2016-02-05 NOTE — Telephone Encounter (Signed)
pls advise

## 2016-02-05 NOTE — Telephone Encounter (Signed)
Please let her know I sent in RX for tessalon pearls

## 2016-02-06 ENCOUNTER — Telehealth: Payer: Self-pay | Admitting: Obstetrics and Gynecology

## 2016-02-06 ENCOUNTER — Other Ambulatory Visit: Payer: Self-pay | Admitting: *Deleted

## 2016-02-06 MED ORDER — BENZONATATE 200 MG PO CAPS: 200.0000 mg | ORAL_CAPSULE | Freq: Three times a day (TID) | ORAL | Status: DC | PRN

## 2016-02-06 NOTE — Telephone Encounter (Signed)
Done-ac 

## 2016-02-06 NOTE — Telephone Encounter (Signed)
Melody sent the tessalon to [facility] and she needed it to go to [facility]. She is not feeling any better. Coughing her head off

## 2016-02-13 ENCOUNTER — Ambulatory Visit (INDEPENDENT_AMBULATORY_CARE_PROVIDER_SITE_OTHER): Payer: 59 | Admitting: Family Medicine

## 2016-02-13 ENCOUNTER — Encounter: Payer: Self-pay | Admitting: Family Medicine

## 2016-02-13 VITALS — BP 112/62 | HR 78 | Temp 97.7°F | Wt 185.2 lb

## 2016-02-13 DIAGNOSIS — J209 Acute bronchitis, unspecified: Secondary | ICD-10-CM | POA: Diagnosis not present

## 2016-02-13 MED ORDER — PREDNISONE 50 MG PO TABS: ORAL_TABLET | ORAL | Status: DC

## 2016-02-13 MED ORDER — HYDROCOD POLST-CPM POLST ER 10-8 MG/5ML PO SUER: 5.0000 mL | Freq: Two times a day (BID) | ORAL | Status: DC | PRN

## 2016-02-13 NOTE — Patient Instructions (Signed)
Take the medication as prescribed.  Follow up as needed.  Take care  Dr. Lacinda Axon   Acute Bronchitis Bronchitis is inflammation of the airways that extend from the windpipe into the lungs (bronchi). The inflammation often causes mucus to develop. This leads to a cough, which is the most common symptom of bronchitis.  In acute bronchitis, the condition usually develops suddenly and goes away over time, usually in a couple weeks. Smoking, allergies, and asthma can make bronchitis worse. Repeated episodes of bronchitis may cause further lung problems.  CAUSES Acute bronchitis is most often caused by the same virus that causes a cold. The virus can spread from person to person (contagious) through coughing, sneezing, and touching contaminated objects. SIGNS AND SYMPTOMS   Cough.   Fever.   Coughing up mucus.   Body aches.   Chest congestion.   Chills.   Shortness of breath.   Sore throat.  DIAGNOSIS  Acute bronchitis is usually diagnosed through a physical exam. Your health care provider will also ask you questions about your medical history. Tests, such as chest X-rays, are sometimes done to rule out other conditions.  TREATMENT  Acute bronchitis usually goes away in a couple weeks. Oftentimes, no medical treatment is necessary. Medicines are sometimes given for relief of fever or cough. Antibiotic medicines are usually not needed but may be prescribed in certain situations. In some cases, an inhaler may be recommended to help reduce shortness of breath and control the cough. A cool mist vaporizer may also be used to help thin bronchial secretions and make it easier to clear the chest.  HOME CARE INSTRUCTIONS  Get plenty of rest.   Drink enough fluids to keep your urine clear or pale yellow (unless you have a medical condition that requires fluid restriction). Increasing fluids may help thin your respiratory secretions (sputum) and reduce chest congestion, and it will prevent  dehydration.   Take medicines only as directed by your health care provider.  If you were prescribed an antibiotic medicine, finish it all even if you start to feel better.  Avoid smoking and secondhand smoke. Exposure to cigarette smoke or irritating chemicals will make bronchitis worse. If you are a smoker, consider using nicotine gum or skin patches to help control withdrawal symptoms. Quitting smoking will help your lungs heal faster.   Reduce the chances of another bout of acute bronchitis by washing your hands frequently, avoiding people with cold symptoms, and trying not to touch your hands to your mouth, nose, or eyes.   Keep all follow-up visits as directed by your health care provider.  SEEK MEDICAL CARE IF: Your symptoms do not improve after 1 week of treatment.  SEEK IMMEDIATE MEDICAL CARE IF:  You develop an increased fever or chills.   You have chest pain.   You have severe shortness of breath.  You have bloody sputum.   You develop dehydration.  You faint or repeatedly feel like you are going to pass out.  You develop repeated vomiting.  You develop a severe headache. MAKE SURE YOU:   Understand these instructions.  Will watch your condition.  Will get help right away if you are not doing well or get worse.   This information is not intended to replace advice given to you by your health care provider. Make sure you discuss any questions you have with your health care provider.   Document Released: 08/27/2004 Document Revised: 08/10/2014 Document Reviewed: 01/10/2013 Elsevier Interactive Patient Education [employer].

## 2016-02-13 NOTE — Progress Notes (Signed)
Subjective:  Patient ID: Kendra Ramos, female    DOB: [birth year]  Age: 30 y.o. MRN: [id number]  CC: Cough  HPI:  30 year old female presents with the above complaint.  Patient states that she's been sick since the end of June. She states that her symptoms began with runny nose, congestion, sinus pressure/pain. This slowly progressed and now her predominant symptom is nonproductive cough. The runny nose, congestion, and sinus pressure has improved. Over the duration of her illness she has taken over-the-counter cough medication and was also put on amoxicillin by [facility]. She has felt no better with treatment. She is active felt worse since she started the antibiotic a few days ago. Cough is severe and unrelenting. Interfering with sleep. She reports associated fatigue. No reports of shortness of breath. No fever. No other complaints at this time.  Social Hx   Social History   Social History  . Marital Status: Married    Spouse Name: N/A  . Number of Children: N/A  . Years of Education: N/A   Social History Main Topics  . Smoking status: Never Smoker   . Smokeless tobacco: Never Used  . Alcohol Use: 3.5 oz/week    7 Standard drinks or equivalent per week  . Drug Use: No  . Sexual Activity: Yes    Birth Control/ Protection: Inserts   Other Topics Concern  . None   Social History Narrative   Review of Systems  Constitutional: Positive for fatigue. Negative for fever.  HENT: Positive for congestion, rhinorrhea and sinus pressure.   Respiratory: Positive for cough.    Objective:  BP 112/62 mmHg  Pulse 78  Temp(Src) 97.7 F (36.5 C) (Oral)  Wt 185 lb 4 oz (84.029 kg)  SpO2 98%  BP/Weight 02/13/2016 [id number]  Systolic BP [id number]  Diastolic BP 62 85 77  Wt. (Lbs) 185.25 183.5 183.4  BMI 29.91 29.63 29.62   Physical Exam  Constitutional: She is oriented to person, place, and time. She appears well-developed. No distress.  Appears fatigued.  HENT:    Mouth/Throat: Oropharynx is clear and moist.  TM's normal bilaterally.  Neck: Neck supple.  Cardiovascular: Normal rate and regular rhythm.   Pulmonary/Chest: Effort normal. She has no wheezes. She has no rales.  Lymphadenopathy:    She has no cervical adenopathy.  Neurological: She is alert and oriented to person, place, and time.  Psychiatric: She has a normal mood and affect.  Vitals reviewed.  Lab Results  Component Value Date   WBC 10.2 03/07/2014   HGB 10.7* 03/07/2014   HCT 29.7* 03/09/2014   PLT 220 03/07/2014   GLUCOSE 83 03/22/2015   CHOL 167 03/22/2015   TRIG 127 03/22/2015   HDL 66 03/22/2015   LDLCALC 76 03/22/2015   ALT 23 03/22/2015   AST 26 03/22/2015   NA 140 03/22/2015   K 4.2 03/22/2015   CL 100 03/22/2015   CREATININE 0.71 03/22/2015   BUN 12 03/22/2015   CO2 23 03/22/2015   TSH 3.050 03/22/2015   HGBA1C 5.5 03/22/2015   Assessment & Plan:   Problem List Items Addressed This Visit    Acute bronchitis - Primary    New problem. Patient with initial viral URI which has now progressed to persistent cough/acute bronchitis. Next Treated with prednisone and Tussionex. Advised the patient to go ahead and finish the amoxicillin        Meds ordered this encounter  Medications  . predniSONE (DELTASONE) 50 MG tablet  Sig: 1 tablet daily x 5 days    Dispense:  5 tablet    Refill:  0  . chlorpheniramine-HYDROcodone (TUSSIONEX PENNKINETIC ER) 10-8 MG/5ML SUER    Sig: Take 5 mLs by mouth every 12 (twelve) hours as needed.    Dispense:  115 mL    Refill:  0    Follow-up: PRN  [place]

## 2016-02-13 NOTE — Assessment & Plan Note (Signed)
New problem. Patient with initial viral URI which has now progressed to persistent cough/acute bronchitis. Next Treated with prednisone and Tussionex. Advised the patient to go ahead and finish the amoxicillin

## 2016-02-13 NOTE — Progress Notes (Signed)
Pre visit review using our clinic review tool, if applicable. No additional management support is needed unless otherwise documented below in the visit note. 

## 2016-02-20 ENCOUNTER — Other Ambulatory Visit: Payer: Self-pay | Admitting: Internal Medicine

## 2016-02-20 NOTE — Telephone Encounter (Signed)
Spoke with the patient, she would like a refill if possible, if not she will take OTC nyquil.  Please advise, thanks

## 2016-02-20 NOTE — Telephone Encounter (Signed)
Pt called about still coughing at night only. Pt did not take it last night and coughed her head off. Can pt get another refill?  Pharmacy is [facility], [place]  Call pt @ [phone]. Thank you!

## 2016-02-21 ENCOUNTER — Ambulatory Visit (INDEPENDENT_AMBULATORY_CARE_PROVIDER_SITE_OTHER): Payer: 59 | Admitting: Obstetrics and Gynecology

## 2016-02-21 ENCOUNTER — Encounter: Payer: Self-pay | Admitting: Obstetrics and Gynecology

## 2016-02-21 VITALS — BP 115/82 | HR 88 | Ht 65.5 in | Wt 183.0 lb

## 2016-02-21 DIAGNOSIS — E663 Overweight: Secondary | ICD-10-CM | POA: Diagnosis not present

## 2016-02-21 MED ORDER — PHENTERMINE HCL 37.5 MG PO TABS: 37.5000 mg | ORAL_TABLET | Freq: Every day | ORAL | Status: DC

## 2016-02-21 MED ORDER — HYDROCOD POLST-CPM POLST ER 10-8 MG/5ML PO SUER: 5.0000 mL | Freq: Two times a day (BID) | ORAL | Status: DC | PRN

## 2016-02-21 MED ORDER — CYANOCOBALAMIN 1000 MCG/ML IJ SOLN: 1000.0000 ug | Freq: Once | INTRAMUSCULAR | Status: DC

## 2016-02-21 NOTE — Progress Notes (Signed)
SUBJECTIVE:  30 y.o. here for follow-up weight loss visit, previously seen 4 weeks ago. Denies any concerns and feels like scales have stalled. Although she is losing inches. Has felt really fatigued over last month. Is still exercising daily and watching calorie intake.  OBJECTIVE:  BP 115/82 mmHg  Pulse 88  Ht 5' 5.5" (1.664 m)  Wt 183 lb (83.008 kg)  BMI 29.98 kg/m2  LMP 02/11/2016  Body mass index is 29.98 kg/(m^2). Patient appears well. ASSESSMENT:  Overweight- restart weight loss meds.  PLAN:  To continue with current medications. B12 1047mcg/ml injection given RTC in 4 weeks as planned  Arian Murley [place], CNM

## 2016-02-21 NOTE — Telephone Encounter (Signed)
Refilled.  In the future please route to the doctor who treated patient last. (Dr Lacinda Axon . I have not seen the patient in over a year )

## 2016-03-25 ENCOUNTER — Encounter (INDEPENDENT_AMBULATORY_CARE_PROVIDER_SITE_OTHER): Payer: Self-pay

## 2016-03-25 ENCOUNTER — Encounter: Payer: Self-pay | Admitting: Internal Medicine

## 2016-03-25 ENCOUNTER — Ambulatory Visit (INDEPENDENT_AMBULATORY_CARE_PROVIDER_SITE_OTHER): Payer: 59 | Admitting: Internal Medicine

## 2016-03-25 VITALS — BP 108/76 | HR 69 | Temp 98.1°F | Resp 12 | Ht 65.0 in | Wt 182.5 lb

## 2016-03-25 DIAGNOSIS — R7301 Impaired fasting glucose: Secondary | ICD-10-CM | POA: Diagnosis not present

## 2016-03-25 DIAGNOSIS — R748 Abnormal levels of other serum enzymes: Secondary | ICD-10-CM

## 2016-03-25 DIAGNOSIS — R87610 Atypical squamous cells of undetermined significance on cytologic smear of cervix (ASC-US): Secondary | ICD-10-CM

## 2016-03-25 DIAGNOSIS — R7989 Other specified abnormal findings of blood chemistry: Secondary | ICD-10-CM

## 2016-03-25 DIAGNOSIS — Z0000 Encounter for general adult medical examination without abnormal findings: Secondary | ICD-10-CM | POA: Diagnosis not present

## 2016-03-25 DIAGNOSIS — E669 Obesity, unspecified: Secondary | ICD-10-CM | POA: Diagnosis not present

## 2016-03-25 MED ORDER — CYANOCOBALAMIN 1000 MCG SL SUBL: 1.0000 | SUBLINGUAL_TABLET | Freq: Every day | SUBLINGUAL | 3 refills | Status: DC

## 2016-03-25 NOTE — Progress Notes (Signed)
Pre-visit discussion using our clinic review tool. No additional management support is needed unless otherwise documented below in the visit note.  

## 2016-03-25 NOTE — Patient Instructions (Addendum)
Good to see you!    I am rechecking your thyroid  and A1c and liver enzymes today   Check with [facility] about tetanus booster

## 2016-03-25 NOTE — Progress Notes (Signed)
Patient ID: AMARYA STAUDINGER, female    DOB: Sep 11, 1985  Age: 30 y.o. MRN: [id number]  The patient is here for annual wellness examination and management of other chronic and acute problems.  GYN exam and issues are managed by GYN  PAP normal 2016 Last seen August 2014  .  Delivered healthy full term infant girl in August 2015.     The risk factors are reflected in the social history.  The roster of all physicians providing medical care to patient - is listed in the Snapshot section of the chart.  Home safety : The patient has smoke detectors in the home. They wear seatbelts.  There are no firearms at home. There is no violence in the home.   There is no risks for hepatitis, STDs or HIV. There is no   history of blood transfusion. They have no travel history to infectious disease endemic areas of the world.  The patient has seen their dentist in the last six month. They have seen their eye doctor in the last year.  They do not  have excessive sun exposure. Discussed the need for sun protection: hats, long sleeves and use of sunscreen if there is significant sun exposure.   Diet: the importance of a healthy diet is discussed. They do have a healthy diet.  The benefits of regular aerobic exercise were discussed. She walks 4 times per week ,  20 minutes.   Depression screen: there are no signs or vegative symptoms of depression- irritability, change in appetite, anhedonia, sadness/tearfullness.  The following portions of the patient's history were reviewed and updated as appropriate: allergies, current medications, past family history, past medical history,  past surgical history, past social history  and problem list.  Visual acuity was not assessed per patient preference since she has regular follow up with her ophthalmologist. Hearing and body mass index were assessed and reviewed.   During the course of the visit the patient was educated and counseled about appropriate screening and  preventive services including : fall prevention , diabetes screening, nutrition counseling, colorectal cancer screening, and recommended immunizations.    CC: The primary encounter diagnosis was Impaired fasting glucose. Diagnoses of Abnormal TSH, Obesity, unspecified, Visit for preventive health examination, and Atypical squamous cells of undetermined significance on cytologic smear of cervix (ASC-US) were also pertinent to this visit.  Obesity management:   August 2016 weight was 222 lbs when she sought assistance from GYN.  Started taking phentermine .  Had lost 10 lbs without it,  Then used it for 2-3 months.   Did not tolerate it.  Started using smaller bowls for portion control. Using [facility] gym every day at lunch for 30 minutes "Daily burn" app $12/mo  Using B12 injections, monthly,   and prn phentermine.  Finds herself exhausted 2-3 days prior to next monthly injection .  Discussed supplementing orally or moving injections up to every 3 weeks.   Goal is 160  BMI 26.6    History Aysiah has a past medical history of Anxiety; Contraception; Headache; and Vaginal Pap smear, abnormal.   She has no past surgical history on file.   Her family history includes Cancer in her mother; Diabetes in her father, maternal grandfather, maternal grandmother, mother, paternal grandfather, and paternal grandmother.She reports that she has never smoked. She has never used smokeless tobacco. She reports that she drinks about 3.5 oz of alcohol per week . She reports that she does not use drugs.  Outpatient Medications Prior to  Visit  Medication Sig Dispense Refill  . clonazePAM (KLONOPIN) 0.5 MG tablet Take 1 tablet (0.5 mg total) by mouth 2 (two) times daily as needed for anxiety. 90 tablet 1  . cyanocobalamin (,VITAMIN B-12,) 1000 MCG/ML injection Inject 1 mL (1,000 mcg total) into the muscle once. 10 mL 1  . diphenoxylate-atropine (LOMOTIL) 2.5-0.025 MG per tablet Take 1 tablet by mouth daily. Reported on  12/05/2015    . fish oil-omega-3 fatty acids 1000 MG capsule Take 2 g by mouth daily.    Marland Kitchen loratadine (CLARITIN) 10 MG tablet Take 10 mg by mouth daily.    . montelukast (SINGULAIR) 10 MG tablet Take 1 tablet (10 mg total) by mouth at bedtime. 90 tablet 3  . Multiple Vitamin (MULTIVITAMIN) capsule Take 1 capsule by mouth daily.    . phentermine (ADIPEX-P) 37.5 MG tablet Take 1 tablet (37.5 mg total) by mouth daily before breakfast. 30 tablet 2  . etonogestrel-ethinyl estradiol (NUVARING) 0.12-0.015 MG/24HR vaginal ring Insert vaginally and leave in place for 3 consecutive weeks, then remove for 1 week. 3 each 4   No facility-administered medications prior to visit.     Review of Systems   Patient denies headache, fevers, malaise, unintentional weight loss, skin rash, eye pain, sinus congestion and sinus pain, sore throat, dysphagia,  hemoptysis , cough, dyspnea, wheezing, chest pain, palpitations, orthopnea, edema, abdominal pain, nausea, melena, diarrhea, constipation, flank pain, dysuria, hematuria, urinary  Frequency, nocturia, numbness, tingling, seizures,  Focal weakness, Loss of consciousness,  Tremor, insomnia, depression, anxiety, and suicidal ideation.      Objective:  BP 108/76   Pulse 69   Temp 98.1 F (36.7 C) (Oral)   Resp 12   Ht 5\' 5"  (1.651 m)   Wt 182 lb 8 oz (82.8 kg)   LMP 03/06/2016 (Approximate)   SpO2 98%   BMI 30.37 kg/m   Physical Exam   General appearance: alert, cooperative and appears stated age Ears: normal TM's and external ear canals both ears Throat: lips, mucosa, and tongue normal; teeth and gums normal Neck: no adenopathy, no carotid bruit, supple, symmetrical, trachea midline and thyroid not enlarged, symmetric, no tenderness/mass/nodules Back: symmetric, no curvature. ROM normal. No CVA tenderness. Lungs: clear to auscultation bilaterally Heart: regular rate and rhythm, S1, S2 normal, no murmur, click, rub or gallop Abdomen: soft, non-tender;  bowel sounds normal; no masses,  no organomegaly Pulses: 2+ and symmetric Skin: Skin color, texture, turgor normal. No rashes or lesions Lymph nodes: Cervical, supraclavicular, and axillary nodes normal.    Assessment & Plan:   Problem List Items Addressed This Visit    Abnormal Pap smear of cervix    continue annual PAP smears with OB/GYN Lorelle Gibbs , NP      Obesity, unspecified    I have congratulated her in reduction of   BMI and encouraged  Continued weight loss with goal of 10% of body weigh over the next 6 months using a low glycemic index diet and regular exercise a minimum of 5 days per week.        Visit for preventive health examination    Annual comprehensive preventive exam was done as well as an evaluation and management of chronic conditions .  During the course of the visit the patient was educated and counseled about appropriate screening and preventive services including :  diabetes screening, lipid analysis with projected  10 year  risk for CAD , nutrition counseling, breast, cervical and colorectal cancer screening, and recommended  immunizations.  Printed recommendations for health maintenance screenings was given       Other Visit Diagnoses    Impaired fasting glucose    -  Primary   Relevant Orders   Comprehensive metabolic panel (Completed)   Hemoglobin A1c (Completed)   Abnormal TSH       Relevant Orders   TSH (Completed)      I am having Ms. Dehart start on Cyanocobalamin. I am also having her maintain her fish oil-omega-3 fatty acids, loratadine, diphenoxylate-atropine, etonogestrel-ethinyl estradiol, clonazePAM, multivitamin, montelukast, cyanocobalamin, and phentermine.  Meds ordered this encounter  Medications  . Cyanocobalamin 1000 MCG SUBL    Sig: Place 1 tablet (1,000 mcg total) under the tongue daily.    Dispense:  90 tablet    Refill:  3    There are no discontinued medications.  Follow-up: No Follow-up on file.   Crecencio Mc, MD

## 2016-03-26 LAB — COMPREHENSIVE METABOLIC PANEL
ALBUMIN: 4.4 g/dL (ref 3.5–5.5)
ALK PHOS: 47 IU/L (ref 39–117)
ALT: 27 IU/L (ref 0–32)
AST: 27 IU/L (ref 0–40)
Albumin/Globulin Ratio: 1.7 (ref 1.2–2.2)
BILIRUBIN TOTAL: 0.2 mg/dL (ref 0.0–1.2)
BUN / CREAT RATIO: 16 (ref 9–23)
BUN: 15 mg/dL (ref 6–20)
CHLORIDE: 99 mmol/L (ref 96–106)
CO2: 24 mmol/L (ref 18–29)
CREATININE: 0.91 mg/dL (ref 0.57–1.00)
Calcium: 9.6 mg/dL (ref 8.7–10.2)
GFR calc Af Amer: 97 mL/min/{1.73_m2} (ref >59)
GFR calc non Af Amer: 84 mL/min/{1.73_m2} (ref >59)
GLUCOSE: 94 mg/dL (ref 65–99)
Globulin, Total: 2.6 g/dL (ref 1.5–4.5)
Potassium: 4.6 mmol/L (ref 3.5–5.2)
Sodium: 139 mmol/L (ref 134–144)
TOTAL PROTEIN: 7 g/dL (ref 6.0–8.5)

## 2016-03-26 LAB — HEMOGLOBIN A1C
ESTIMATED AVERAGE GLUCOSE: 100 mg/dL
Hgb A1c MFr Bld: 5.1 % (ref 4.8–5.6)

## 2016-03-26 LAB — TSH: TSH: 2.39 u[IU]/mL (ref 0.450–4.500)

## 2016-03-27 DIAGNOSIS — R7989 Other specified abnormal findings of blood chemistry: Secondary | ICD-10-CM | POA: Insufficient documentation

## 2016-03-27 DIAGNOSIS — Z0000 Encounter for general adult medical examination without abnormal findings: Secondary | ICD-10-CM | POA: Insufficient documentation

## 2016-03-27 NOTE — Assessment & Plan Note (Signed)
I have congratulated her in reduction of   BMI and encouraged  Continued weight loss with goal of 10% of body weigh over the next 6 months using a low glycemic index diet and regular exercise a minimum of 5 days per week.    

## 2016-03-27 NOTE — Assessment & Plan Note (Signed)
continue annual PAP smears with OB/GYN Lorelle Gibbs , NP

## 2016-03-27 NOTE — Assessment & Plan Note (Addendum)
Annual comprehensive preventive exam was done as well as an evaluation and management of chronic conditions .  During the course of the visit the patient was educated and counseled about appropriate screening and preventive services including :  diabetes screening, thyroid function  screen and assessment of liver enzymes , all of which were normal.  Lipoids were normal last year.  nutrition counseling, breast, cervical and colorectal cancer screening, and recommended immunizations were also reviewed. .  Printed recommendations for health maintenance screenings was given

## 2016-03-31 ENCOUNTER — Encounter: Payer: Self-pay | Admitting: *Deleted

## 2016-04-03 ENCOUNTER — Encounter: Payer: 59 | Admitting: Obstetrics and Gynecology

## 2016-04-21 ENCOUNTER — Encounter: Payer: 59 | Admitting: Obstetrics and Gynecology

## 2016-05-05 ENCOUNTER — Ambulatory Visit (INDEPENDENT_AMBULATORY_CARE_PROVIDER_SITE_OTHER): Payer: 59 | Admitting: Obstetrics and Gynecology

## 2016-05-05 ENCOUNTER — Encounter: Payer: Self-pay | Admitting: Obstetrics and Gynecology

## 2016-05-05 VITALS — BP 110/77 | HR 80 | Ht 65.0 in | Wt 179.1 lb

## 2016-05-05 DIAGNOSIS — Z01419 Encounter for gynecological examination (general) (routine) without abnormal findings: Secondary | ICD-10-CM

## 2016-05-05 NOTE — Patient Instructions (Addendum)
Thank you for enrolling in [facility]. Please follow the instructions below to securely access your online medical record. MyChart allows you to send messages to your doctor, view your test results, renew your prescriptions, schedule appointments, and more.  How Do I Sign Up? 1. In your Internet browser, go to [email or web address]. 2. Click on the New  User? link in the Sign In box.  3. Enter your MyChart Access Code exactly as it appears below. You will not need to use this code after you have completed the sign-up process. If you do not sign up before the expiration date, you must request a new code. MyChart Access Code: [email or web address] Expires: 07/04/2016  2:33 PM  4. Enter the last four digits of your Social Security Number (xxxx) and Date of Birth (mm/dd/yyyy) as indicated and click Next. You will be taken to the next sign-up page. 5. Create a MyChart ID. This will be your MyChart login ID and cannot be changed, so think of one that is secure and easy to remember. 6. Create a MyChart password. You can change your password at any time. 7. Enter your Password Reset Question and Answer and click Next. This can be used at a later time if you forget your password.  8. Select your communication preference, and if applicable enter your e-mail address. You will receive e-mail notification when new information is available in MyChart by choosing to receive e-mail notifications and filling in your e-mail. 9. Click Sign In. You can now view your medical record.   Additional Information If you have questions, you can email [email or web address] or call [phone] to talk to our [facility] staff. Remember, MyChart is NOT to be used for urgent needs. For medical emergencies, dial 911.   Preventive Care for Adults, Female A healthy lifestyle and preventive care can promote health and wellness. Preventive health guidelines for women include the following key practices.  A routine  yearly physical is a good way to check with your health care provider about your health and preventive screening. It is a chance to share any concerns and updates on your health and to receive a thorough exam.  Visit your dentist for a routine exam and preventive care every 6 months. Brush your teeth twice a day and floss once a day. Good oral hygiene prevents tooth decay and gum disease.  The frequency of eye exams is based on your age, health, family medical history, use of contact lenses, and other factors. Follow your health care provider's recommendations for frequency of eye exams.  Eat a healthy diet. Foods like vegetables, fruits, whole grains, low-fat dairy products, and lean protein foods contain the nutrients you need without too many calories. Decrease your intake of foods high in solid fats, added sugars, and salt. Eat the right amount of calories for you.Get information about a proper diet from your health care provider, if necessary.  Regular physical exercise is one of the most important things you can do for your health. Most adults should get at least 150 minutes of moderate-intensity exercise (any activity that increases your heart rate and causes you to sweat) each week. In addition, most adults need muscle-strengthening exercises on 2 or more days a week.  Maintain a healthy weight. The body mass index (BMI) is a screening tool to identify possible weight problems. It provides an estimate of body fat based on height and weight. Your health care provider can find your BMI and can help you achieve or  maintain a healthy weight.For adults 20 years and older:  A BMI below 18.5 is considered underweight.  A BMI of 18.5 to 24.9 is normal.  A BMI of 25 to 29.9 is considered overweight.  A BMI of 30 and above is considered obese.  Maintain normal blood lipids and cholesterol levels by exercising and minimizing your intake of saturated fat. Eat a balanced diet with plenty of fruit  and vegetables. Blood tests for lipids and cholesterol should begin at age 77 and be repeated every 5 years. If your lipid or cholesterol levels are high, you are over 50, or you are at high risk for heart disease, you may need your cholesterol levels checked more frequently.Ongoing high lipid and cholesterol levels should be treated with medicines if diet and exercise are not working.  If you smoke, find out from your health care provider how to quit. If you do not use tobacco, do not start.  Lung cancer screening is recommended for adults aged 55-80 years who are at high risk for developing lung cancer because of a history of smoking. A yearly low-dose CT scan of the lungs is recommended for people who have at least a 30-pack-year history of smoking and are a current smoker or have quit within the past 15 years. A pack year of smoking is smoking an average of 1 pack of cigarettes a day for 1 year (for example: 1 pack a day for 30 years or 2 packs a day for 15 years). Yearly screening should continue until the smoker has stopped smoking for at least 15 years. Yearly screening should be stopped for people who develop a health problem that would prevent them from having lung cancer treatment.  If you are pregnant, do not drink alcohol. If you are breastfeeding, be very cautious about drinking alcohol. If you are not pregnant and choose to drink alcohol, do not have more than 1 drink per day. One drink is considered to be 12 ounces (355 mL) of beer, 5 ounces (148 mL) of wine, or 1.5 ounces (44 mL) of liquor.  Avoid use of street drugs. Do not share needles with anyone. Ask for help if you need support or instructions about stopping the use of drugs.  High blood pressure causes heart disease and increases the risk of stroke. Your blood pressure should be checked at least every 1 to 2 years. Ongoing high blood pressure should be treated with medicines if weight loss and exercise do not work.  If you are  71-42 years old, ask your health care provider if you should take aspirin to prevent strokes.  Diabetes screening is done by taking a blood sample to check your blood glucose level after you have not eaten for a certain period of time (fasting). If you are not overweight and you do not have risk factors for diabetes, you should be screened once every 3 years starting at age 70. If you are overweight or obese and you are 38-34 years of age, you should be screened for diabetes every year as part of your cardiovascular risk assessment.  Breast cancer screening is essential preventive care for women. You should practice "breast self-awareness." This means understanding the normal appearance and feel of your breasts and may include breast self-examination. Any changes detected, no matter how small, should be reported to a health care provider. Women in their 25s and 30s should have a clinical breast exam (CBE) by a health care provider as part of a regular health exam  every 1 to 3 years. After age 38, women should have a CBE every year. Starting at age 55, women should consider having a mammogram (breast X-ray test) every year. Women who have a family history of breast cancer should talk to their health care provider about genetic screening. Women at a high risk of breast cancer should talk to their health care providers about having an MRI and a mammogram every year.  Breast cancer gene (BRCA)-related cancer risk assessment is recommended for women who have family members with BRCA-related cancers. BRCA-related cancers include breast, ovarian, tubal, and peritoneal cancers. Having family members with these cancers may be associated with an increased risk for harmful changes (mutations) in the breast cancer genes BRCA1 and BRCA2. Results of the assessment will determine the need for genetic counseling and BRCA1 and BRCA2 testing.  Your health care provider may recommend that you be screened regularly for cancer  of the pelvic organs (ovaries, uterus, and vagina). This screening involves a pelvic examination, including checking for microscopic changes to the surface of your cervix (Pap test). You may be encouraged to have this screening done every 3 years, beginning at age 59.  For women ages 52-65, health care providers may recommend pelvic exams and Pap testing every 3 years, or they may recommend the Pap and pelvic exam, combined with testing for human papilloma virus (HPV), every 5 years. Some types of HPV increase your risk of cervical cancer. Testing for HPV may also be done on women of any age with unclear Pap test results.  Other health care providers may not recommend any screening for nonpregnant women who are considered low risk for pelvic cancer and who do not have symptoms. Ask your health care provider if a screening pelvic exam is right for you.  If you have had past treatment for cervical cancer or a condition that could lead to cancer, you need Pap tests and screening for cancer for at least 20 years after your treatment. If Pap tests have been discontinued, your risk factors (such as having a new sexual partner) need to be reassessed to determine if screening should resume. Some women have medical problems that increase the chance of getting cervical cancer. In these cases, your health care provider may recommend more frequent screening and Pap tests.  Colorectal cancer can be detected and often prevented. Most routine colorectal cancer screening begins at the age of 47 years and continues through age 74 years. However, your health care provider may recommend screening at an earlier age if you have risk factors for colon cancer. On a yearly basis, your health care provider may provide home test kits to check for hidden blood in the stool. Use of a small camera at the end of a tube, to directly examine the colon (sigmoidoscopy or colonoscopy), can detect the earliest forms of colorectal cancer. Talk  to your health care provider about this at age 39, when routine screening begins. Direct exam of the colon should be repeated every 5-10 years through age 17 years, unless early forms of precancerous polyps or small growths are found.  People who are at an increased risk for hepatitis B should be screened for this virus. You are considered at high risk for hepatitis B if:  You were born in a country where hepatitis B occurs often. Talk with your health care provider about which countries are considered high risk.  Your parents were born in a high-risk country and you have not received a shot to  protect against hepatitis B (hepatitis B vaccine).  You have HIV or AIDS.  You use needles to inject street drugs.  You live with, or have sex with, someone who has hepatitis B.  You get hemodialysis treatment.  You take certain medicines for conditions like cancer, organ transplantation, and autoimmune conditions.  Hepatitis C blood testing is recommended for all people born from [birth year] through 1967 and any individual with known risks for hepatitis C.  Practice safe sex. Use condoms and avoid high-risk sexual practices to reduce the spread of sexually transmitted infections (STIs). STIs include gonorrhea, chlamydia, syphilis, trichomonas, herpes, HPV, and human immunodeficiency virus (HIV). Herpes, HIV, and HPV are viral illnesses that have no cure. They can result in disability, cancer, and death.  You should be screened for sexually transmitted illnesses (STIs) including gonorrhea and chlamydia if:  You are sexually active and are younger than 24 years.  You are older than 24 years and your health care provider tells you that you are at risk for this type of infection.  Your sexual activity has changed since you were last screened and you are at an increased risk for chlamydia or gonorrhea. Ask your health care provider if you are at risk.  If you are at risk of being infected with HIV, it is  recommended that you take a prescription medicine daily to prevent HIV infection. This is called preexposure prophylaxis (PrEP). You are considered at risk if:  You are sexually active and do not regularly use condoms or know the HIV status of your partner(s).  You take drugs by injection.  You are sexually active with a partner who has HIV.  Talk with your health care provider about whether you are at high risk of being infected with HIV. If you choose to begin PrEP, you should first be tested for HIV. You should then be tested every 3 months for as long as you are taking PrEP.  Osteoporosis is a disease in which the bones lose minerals and strength with aging. This can result in serious bone fractures or breaks. The risk of osteoporosis can be identified using a bone density scan. Women ages 76 years and over and women at risk for fractures or osteoporosis should discuss screening with their health care providers. Ask your health care provider whether you should take a calcium supplement or vitamin D to reduce the rate of osteoporosis.  Menopause can be associated with physical symptoms and risks. Hormone replacement therapy is available to decrease symptoms and risks. You should talk to your health care provider about whether hormone replacement therapy is right for you.  Use sunscreen. Apply sunscreen liberally and repeatedly throughout the day. You should seek shade when your shadow is shorter than you. Protect yourself by wearing long sleeves, pants, a wide-brimmed hat, and sunglasses year round, whenever you are outdoors.  Once a month, do a whole body skin exam, using a mirror to look at the skin on your back. Tell your health care provider of new moles, moles that have irregular borders, moles that are larger than a pencil eraser, or moles that have changed in shape or color.  Stay current with required vaccines (immunizations).  Influenza vaccine. All adults should be immunized every  year.  Tetanus, diphtheria, and acellular pertussis (Td, Tdap) vaccine. Pregnant women should receive 1 dose of Tdap vaccine during each pregnancy. The dose should be obtained regardless of the length of time since the last dose. Immunization is preferred during the 27th-36th  week of gestation. An adult who has not previously received Tdap or who does not know her vaccine status should receive 1 dose of Tdap. This initial dose should be followed by tetanus and diphtheria toxoids (Td) booster doses every 10 years. Adults with an unknown or incomplete history of completing a 3-dose immunization series with Td-containing vaccines should begin or complete a primary immunization series including a Tdap dose. Adults should receive a Td booster every 10 years.  Varicella vaccine. An adult without evidence of immunity to varicella should receive 2 doses or a second dose if she has previously received 1 dose. Pregnant females who do not have evidence of immunity should receive the first dose after pregnancy. This first dose should be obtained before leaving the health care facility. The second dose should be obtained 4-8 weeks after the first dose.  Human papillomavirus (HPV) vaccine. Females aged 13-26 years who have not received the vaccine previously should obtain the 3-dose series. The vaccine is not recommended for use in pregnant females. However, pregnancy testing is not needed before receiving a dose. If a female is found to be pregnant after receiving a dose, no treatment is needed. In that case, the remaining doses should be delayed until after the pregnancy. Immunization is recommended for any person with an immunocompromised condition through the age of 51 years if she did not get any or all doses earlier. During the 3-dose series, the second dose should be obtained 4-8 weeks after the first dose. The third dose should be obtained 24 weeks after the first dose and 16 weeks after the second dose.  Zoster  vaccine. One dose is recommended for adults aged 80 years or older unless certain conditions are present.  Measles, mumps, and rubella (MMR) vaccine. Adults born before [birth year] generally are considered immune to measles and mumps. Adults born in [birth year] or later should have 1 or more doses of MMR vaccine unless there is a contraindication to the vaccine or there is laboratory evidence of immunity to each of the three diseases. A routine second dose of MMR vaccine should be obtained at least 28 days after the first dose for students attending postsecondary schools, health care workers, or international travelers. People who received inactivated measles vaccine or an unknown type of measles vaccine during 1963-1967 should receive 2 doses of MMR vaccine. People who received inactivated mumps vaccine or an unknown type of mumps vaccine before 1979 and are at high risk for mumps infection should consider immunization with 2 doses of MMR vaccine. For females of childbearing age, rubella immunity should be determined. If there is no evidence of immunity, females who are not pregnant should be vaccinated. If there is no evidence of immunity, females who are pregnant should delay immunization until after pregnancy. Unvaccinated health care workers born before [birth year] who lack laboratory evidence of measles, mumps, or rubella immunity or laboratory confirmation of disease should consider measles and mumps immunization with 2 doses of MMR vaccine or rubella immunization with 1 dose of MMR vaccine.  Pneumococcal 13-valent conjugate (PCV13) vaccine. When indicated, a person who is uncertain of his immunization history and has no record of immunization should receive the PCV13 vaccine. All adults 10 years of age and older should receive this vaccine. An adult aged 63 years or older who has certain medical conditions and has not been previously immunized should receive 1 dose of PCV13 vaccine. This PCV13 should be followed with a dose  of pneumococcal polysaccharide (PPSV23) vaccine. Adults who  are at high risk for pneumococcal disease should obtain the PPSV23 vaccine at least 8 weeks after the dose of PCV13 vaccine. Adults older than 32 years of age who have normal immune system function should obtain the PPSV23 vaccine dose at least 1 year after the dose of PCV13 vaccine.  Pneumococcal polysaccharide (PPSV23) vaccine. When PCV13 is also indicated, PCV13 should be obtained first. All adults aged 35 years and older should be immunized. An adult younger than age 46 years who has certain medical conditions should be immunized. Any person who resides in a nursing home or long-term care facility should be immunized. An adult smoker should be immunized. People with an immunocompromised condition and certain other conditions should receive both PCV13 and PPSV23 vaccines. People with human immunodeficiency virus (HIV) infection should be immunized as soon as possible after diagnosis. Immunization during chemotherapy or radiation therapy should be avoided. Routine use of PPSV23 vaccine is not recommended for American Indians, [place] Natives, or people younger than 65 years unless there are medical conditions that require PPSV23 vaccine. When indicated, people who have unknown immunization and have no record of immunization should receive PPSV23 vaccine. One-time revaccination 5 years after the first dose of PPSV23 is recommended for people aged 19-64 years who have chronic kidney failure, nephrotic syndrome, asplenia, or immunocompromised conditions. People who received 1-2 doses of PPSV23 before age 40 years should receive another dose of PPSV23 vaccine at age 59 years or later if at least 5 years have passed since the previous dose. Doses of PPSV23 are not needed for people immunized with PPSV23 at or after age 101 years.  Meningococcal vaccine. Adults with asplenia or persistent complement component deficiencies should receive 2 doses of  quadrivalent meningococcal conjugate (MenACWY-D) vaccine. The doses should be obtained at least 2 months apart. Microbiologists working with certain meningococcal bacteria, [place] recruits, people at risk during an outbreak, and people who travel to or live in countries with a high rate of meningitis should be immunized. A first-year college student up through age 74 years who is living in a residence hall should receive a dose if she did not receive a dose on or after her 16th birthday. Adults who have certain high-risk conditions should receive one or more doses of vaccine.  Hepatitis A vaccine. Adults who wish to be protected from this disease, have certain high-risk conditions, work with hepatitis A-infected animals, work in hepatitis A research labs, or travel to or work in countries with a high rate of hepatitis A should be immunized. Adults who were previously unvaccinated and who anticipate close contact with an international adoptee during the first 60 days after arrival in the [place] from a country with a high rate of hepatitis A should be immunized.  Hepatitis B vaccine. Adults who wish to be protected from this disease, have certain high-risk conditions, may be exposed to blood or other infectious body fluids, are household contacts or sex partners of hepatitis B positive people, are clients or workers in certain care facilities, or travel to or work in countries with a high rate of hepatitis B should be immunized.  Haemophilus influenzae type b (Hib) vaccine. A previously unvaccinated person with asplenia or sickle cell disease or having a scheduled splenectomy should receive 1 dose of Hib vaccine. Regardless of previous immunization, a recipient of a hematopoietic stem cell transplant should receive a 3-dose series 6-12 months after her successful transplant. Hib vaccine is not recommended for adults with HIV infection. Preventive Services /  Frequency Ages 74 to 39 years  Blood  pressure check.** / Every 3-5 years.  Lipid and cholesterol check.** / Every 5 years beginning at age 9.  Clinical breast exam.** / Every 3 years for women in their 68s and 107s.  BRCA-related cancer risk assessment.** / For women who have family members with a BRCA-related cancer (breast, ovarian, tubal, or peritoneal cancers).  Pap test.** / Every 2 years from ages 36 through 54. Every 3 years starting at age 72 through age 17 or 86 with a history of 3 consecutive normal Pap tests.  HPV screening.** / Every 3 years from ages 27 through ages 25 to 58 with a history of 3 consecutive normal Pap tests.  Hepatitis C blood test.** / For any individual with known risks for hepatitis C.  Skin self-exam. / Monthly.  Influenza vaccine. / Every year.  Tetanus, diphtheria, and acellular pertussis (Tdap, Td) vaccine.** / Consult your health care provider. Pregnant women should receive 1 dose of Tdap vaccine during each pregnancy. 1 dose of Td every 10 years.  Varicella vaccine.** / Consult your health care provider. Pregnant females who do not have evidence of immunity should receive the first dose after pregnancy.  HPV vaccine. / 3 doses over 6 months, if 37 and younger. The vaccine is not recommended for use in pregnant females. However, pregnancy testing is not needed before receiving a dose.  Measles, mumps, rubella (MMR) vaccine.** / You need at least 1 dose of MMR if you were born in 1957 or later. You may also need a 2nd dose. For females of childbearing age, rubella immunity should be determined. If there is no evidence of immunity, females who are not pregnant should be vaccinated. If there is no evidence of immunity, females who are pregnant should delay immunization until after pregnancy.  Pneumococcal 13-valent conjugate (PCV13) vaccine.** / Consult your health care provider.  Pneumococcal polysaccharide (PPSV23) vaccine.** / 1 to 2 doses if you smoke cigarettes or if you have certain  conditions.  Meningococcal vaccine.** / 1 dose if you are age 13 to 66 years and a Market researcher living in a residence hall, or have one of several medical conditions, you need to get vaccinated against meningococcal disease. You may also need additional booster doses.  Hepatitis A vaccine.** / Consult your health care provider.  Hepatitis B vaccine.** / Consult your health care provider.  Haemophilus influenzae type b (Hib) vaccine.** / Consult your health care provider. Ages 23 to 48 years  Blood pressure check.** / Every year.  Lipid and cholesterol check.** / Every 5 years beginning at age 55 years.  Lung cancer screening. / Every year if you are aged 74-80 years and have a 30-pack-year history of smoking and currently smoke or have quit within the past 15 years. Yearly screening is stopped once you have quit smoking for at least 15 years or develop a health problem that would prevent you from having lung cancer treatment.  Clinical breast exam.** / Every year after age 34 years.  BRCA-related cancer risk assessment.** / For women who have family members with a BRCA-related cancer (breast, ovarian, tubal, or peritoneal cancers).  Mammogram.** / Every year beginning at age 74 years and continuing for as long as you are in good health. Consult with your health care provider.  Pap test.** / Every 3 years starting at age 39 years through age 35 or 24 years with a history of 3 consecutive normal Pap tests.  HPV screening.** /  Every 3 years from ages 61 years through ages 19 to 55 years with a history of 3 consecutive normal Pap tests.  Fecal occult blood test (FOBT) of stool. / Every year beginning at age 38 years and continuing until age 12 years. You may not need to do this test if you get a colonoscopy every 10 years.  Flexible sigmoidoscopy or colonoscopy.** / Every 5 years for a flexible sigmoidoscopy or every 10 years for a colonoscopy beginning at age 23 years and  continuing until age 23 years.  Hepatitis C blood test.** / For all people born from [birth year] through 1965 and any individual with known risks for hepatitis C.  Skin self-exam. / Monthly.  Influenza vaccine. / Every year.  Tetanus, diphtheria, and acellular pertussis (Tdap/Td) vaccine.** / Consult your health care provider. Pregnant women should receive 1 dose of Tdap vaccine during each pregnancy. 1 dose of Td every 10 years.  Varicella vaccine.** / Consult your health care provider. Pregnant females who do not have evidence of immunity should receive the first dose after pregnancy.  Zoster vaccine.** / 1 dose for adults aged 36 years or older.  Measles, mumps, rubella (MMR) vaccine.** / You need at least 1 dose of MMR if you were born in 1957 or later. You may also need a second dose. For females of childbearing age, rubella immunity should be determined. If there is no evidence of immunity, females who are not pregnant should be vaccinated. If there is no evidence of immunity, females who are pregnant should delay immunization until after pregnancy.  Pneumococcal 13-valent conjugate (PCV13) vaccine.** / Consult your health care provider.  Pneumococcal polysaccharide (PPSV23) vaccine.** / 1 to 2 doses if you smoke cigarettes or if you have certain conditions.  Meningococcal vaccine.** / Consult your health care provider.  Hepatitis A vaccine.** / Consult your health care provider.  Hepatitis B vaccine.** / Consult your health care provider.  Haemophilus influenzae type b (Hib) vaccine.** / Consult your health care provider. Ages 55 years and over  Blood pressure check.** / Every year.  Lipid and cholesterol check.** / Every 5 years beginning at age 37 years.  Lung cancer screening. / Every year if you are aged 41-80 years and have a 30-pack-year history of smoking and currently smoke or have quit within the past 15 years. Yearly screening is stopped once you have quit smoking for at  least 15 years or develop a health problem that would prevent you from having lung cancer treatment.  Clinical breast exam.** / Every year after age 31 years.  BRCA-related cancer risk assessment.** / For women who have family members with a BRCA-related cancer (breast, ovarian, tubal, or peritoneal cancers).  Mammogram.** / Every year beginning at age 34 years and continuing for as long as you are in good health. Consult with your health care provider.  Pap test.** / Every 3 years starting at age 29 years through age 39 or 69 years with 3 consecutive normal Pap tests. Testing can be stopped between 65 and 70 years with 3 consecutive normal Pap tests and no abnormal Pap or HPV tests in the past 10 years.  HPV screening.** / Every 3 years from ages 5 years through ages 77 or 88 years with a history of 3 consecutive normal Pap tests. Testing can be stopped between 65 and 70 years with 3 consecutive normal Pap tests and no abnormal Pap or HPV tests in the past 10 years.  Fecal occult blood test (FOBT) of  stool. / Every year beginning at age 78 years and continuing until age 38 years. You may not need to do this test if you get a colonoscopy every 10 years.  Flexible sigmoidoscopy or colonoscopy.** / Every 5 years for a flexible sigmoidoscopy or every 10 years for a colonoscopy beginning at age 38 years and continuing until age 45 years.  Hepatitis C blood test.** / For all people born from [birth year] through 1965 and any individual with known risks for hepatitis C.  Osteoporosis screening.** / A one-time screening for women ages 99 years and over and women at risk for fractures or osteoporosis.  Skin self-exam. / Monthly.  Influenza vaccine. / Every year.  Tetanus, diphtheria, and acellular pertussis (Tdap/Td) vaccine.** / 1 dose of Td every 10 years.  Varicella vaccine.** / Consult your health care provider.  Zoster vaccine.** / 1 dose for adults aged 36 years or older.  Pneumococcal  13-valent conjugate (PCV13) vaccine.** / Consult your health care provider.  Pneumococcal polysaccharide (PPSV23) vaccine.** / 1 dose for all adults aged 81 years and older.  Meningococcal vaccine.** / Consult your health care provider.  Hepatitis A vaccine.** / Consult your health care provider.  Hepatitis B vaccine.** / Consult your health care provider.  Haemophilus influenzae type b (Hib) vaccine.** / Consult your health care provider. ** Family history and personal history of risk and conditions may change your health care provider's recommendations.   This information is not intended to replace advice given to you by your health care provider. Make sure you discuss any questions you have with your health care provider.   Document Released: 09/15/2001 Document Revised: 08/10/2014 Document Reviewed: 12/15/2010 Elsevier Interactive Patient Education [employer].

## 2016-05-05 NOTE — Progress Notes (Signed)
Subjective:   Kendra Ramos is a 30 y.o. G3P0 Caucasian female here for a routine well-woman exam.  Patient's last menstrual period was 04/06/2016.    Current complaints: none PCP: Tull[facility]       doesn't desire labs  Social History: Sexual: heterosexual Marital Status: married Living situation: with family Occupation: unknown occupation Tobacco/alcohol: no tobacco use Illicit drugs: no history of illicit drug use  The following portions of the patient's history were reviewed and updated as appropriate: allergies, current medications, past family history, past medical history, past social history, past surgical history and problem list.  Past Medical History Past Medical History:  Diagnosis Date  . Anxiety   . Contraception   . Headache   . Vaginal Pap smear, abnormal     Past Surgical History History reviewed. No pertinent surgical history.  Gynecologic History G1P0  Patient's last menstrual period was 04/06/2016. Contraception: Nuvaring Last Pap: 2016. Results were: normal   Obstetric History OB History  Gravida Para Term Preterm AB Living  1         1  SAB TAB Ectopic Multiple Live Births          1    # Outcome Date GA Lbr Len/2nd Weight Sex Delivery Anes PTL Lv  1 Gravida 2015    F Vag-Spont   LIV      Current Medications Current Outpatient Prescriptions on File Prior to Visit  Medication Sig Dispense Refill  . clonazePAM (KLONOPIN) 0.5 MG tablet Take 1 tablet (0.5 mg total) by mouth 2 (two) times daily as needed for anxiety. 90 tablet 1  . cyanocobalamin (,VITAMIN B-12,) 1000 MCG/ML injection Inject 1 mL (1,000 mcg total) into the muscle once. 10 mL 1  . Cyanocobalamin 1000 MCG SUBL Place 1 tablet (1,000 mcg total) under the tongue daily. 90 tablet 3  . diphenoxylate-atropine (LOMOTIL) 2.5-0.025 MG per tablet Take 1 tablet by mouth daily. Reported on 12/05/2015    . fish oil-omega-3 fatty acids 1000 MG capsule Take 2 g by mouth daily.    . montelukast  (SINGULAIR) 10 MG tablet Take 1 tablet (10 mg total) by mouth at bedtime. 90 tablet 3  . Multiple Vitamin (MULTIVITAMIN) capsule Take 1 capsule by mouth daily.    . phentermine (ADIPEX-P) 37.5 MG tablet Take 1 tablet (37.5 mg total) by mouth daily before breakfast. 30 tablet 2  . etonogestrel-ethinyl estradiol (NUVARING) 0.12-0.015 MG/24HR vaginal ring Insert vaginally and leave in place for 3 consecutive weeks, then remove for 1 week. 3 each 4  . loratadine (CLARITIN) 10 MG tablet Take 10 mg by mouth daily.     No current facility-administered medications on file prior to visit.     Review of Systems Patient denies any headaches, blurred vision, shortness of breath, chest pain, abdominal pain, problems with bowel movements, urination, or intercourse.  Objective:  BP 110/77   Pulse 80   Ht 5\' 5"  (1.651 m)   Wt 179 lb 1.6 oz (81.2 kg)   LMP 04/06/2016   BMI 29.80 kg/m  Physical Exam  General:  Well developed, well nourished, no acute distress. She is alert and oriented x3. Skin:  Warm and dry Neck:  Midline trachea, no thyromegaly or nodules Cardiovascular: Regular rate and rhythm, no murmur heard Lungs:  Effort normal, all lung fields clear to auscultation bilaterally Breasts:  No dominant palpable mass, retraction, or nipple discharge Abdomen:  Soft, non tender, no hepatosplenomegaly or masses Pelvic:  External genitalia is normal in appearance.  The vagina is normal in appearance. The cervix is bulbous, no CMT.  Thin prep pap is not done . Uterus is felt to be normal size, shape, and contour.  No adnexal masses or tenderness noted. Extremities:  No swelling or varicosities noted Psych:  She has a normal mood and affect  Assessment:   Healthy well-woman exam [facility] user  Plan:  Continue all current meds. F/U 1 year for AE, or sooner if needed   Shaliyah Taite Rockney Ghee, CNM

## 2016-05-12 ENCOUNTER — Encounter: Payer: Self-pay | Admitting: Internal Medicine

## 2016-05-13 ENCOUNTER — Encounter: Payer: Self-pay | Admitting: Internal Medicine

## 2016-05-13 ENCOUNTER — Ambulatory Visit (INDEPENDENT_AMBULATORY_CARE_PROVIDER_SITE_OTHER): Payer: 59 | Admitting: Internal Medicine

## 2016-05-13 VITALS — BP 122/78 | HR 72 | Temp 98.1°F | Resp 14 | Ht 65.0 in | Wt 179.0 lb

## 2016-05-13 DIAGNOSIS — J209 Acute bronchitis, unspecified: Secondary | ICD-10-CM

## 2016-05-13 MED ORDER — LEVOFLOXACIN 500 MG PO TABS: 500.0000 mg | ORAL_TABLET | Freq: Every day | ORAL | 0 refills | Status: DC

## 2016-05-13 MED ORDER — PREDNISONE 10 MG PO TABS: ORAL_TABLET | ORAL | 0 refills | Status: DC

## 2016-05-13 MED ORDER — METHYLPREDNISOLONE ACETATE 40 MG/ML IJ SUSP
40.0000 mg | Freq: Once | INTRAMUSCULAR | Status: AC
  Administered 2016-05-13: 40 mg via INTRAMUSCULAR

## 2016-05-13 MED ORDER — HYDROCOD POLST-CPM POLST ER 10-8 MG/5ML PO SUER: 5.0000 mL | Freq: Every evening | ORAL | 0 refills | Status: DC | PRN

## 2016-05-13 MED ORDER — ALBUTEROL SULFATE HFA 108 (90 BASE) MCG/ACT IN AERS: 2.0000 | INHALATION_SPRAY | Freq: Four times a day (QID) | RESPIRATORY_TRACT | 1 refills | Status: DC | PRN

## 2016-05-13 NOTE — Progress Notes (Signed)
Subjective:  Patient ID: Kendra Ramos, female    DOB: 1985/08/27  Age: 30 y.o. MRN: [id number]  CC: The encounter diagnosis was Acute bronchitis, unspecified organism.  HPI Kendra Ramos presents for recurrent bronchitis.   Her current  symptoms have been  present for  the past week.  and started with nasal congestion and sneezing after a field  Near her home that contained corn was cut down.    She has a history of seasonal allergies and uses Singulair, nasonex,  and generic Claritin.   But has added benadryl at bedtime,  nyquil (ran out of tussionex) and  mucinex D . Feels pressure in both ears but no pain or drainage or altered hearing. Cough is non  productive but she has been wheezing, and  Sinuses are not  painful but congested with very little drainage.     Outpatient Medications Prior to Visit  Medication Sig Dispense Refill  . clonazePAM (KLONOPIN) 0.5 MG tablet Take 1 tablet (0.5 mg total) by mouth 2 (two) times daily as needed for anxiety. 90 tablet 1  . cyanocobalamin (,VITAMIN B-12,) 1000 MCG/ML injection Inject 1 mL (1,000 mcg total) into the muscle once. 10 mL 1  . Cyanocobalamin 1000 MCG SUBL Place 1 tablet (1,000 mcg total) under the tongue daily. 90 tablet 3  . diphenoxylate-atropine (LOMOTIL) 2.5-0.025 MG per tablet Take 1 tablet by mouth daily. Reported on 12/05/2015    . fish oil-omega-3 fatty acids 1000 MG capsule Take 2 g by mouth daily.    Marland Kitchen loratadine (CLARITIN) 10 MG tablet Take 10 mg by mouth daily.    . montelukast (SINGULAIR) 10 MG tablet Take 1 tablet (10 mg total) by mouth at bedtime. 90 tablet 3  . Multiple Vitamin (MULTIVITAMIN) capsule Take 1 capsule by mouth daily.    . phentermine (ADIPEX-P) 37.5 MG tablet Take 1 tablet (37.5 mg total) by mouth daily before breakfast. 30 tablet 2  . etonogestrel-ethinyl estradiol (NUVARING) 0.12-0.015 MG/24HR vaginal ring Insert vaginally and leave in place for 3 consecutive weeks, then remove for 1 week. 3 each 4    No facility-administered medications prior to visit.     Review of Systems;  Patient denies headache, fevers, malaise, unintentional weight loss, skin rash, eye pain, sinus congestion and sinus pain, sore throat, dysphagia,  hemoptysis , cough, dyspnea, wheezing, chest pain, palpitations, orthopnea, edema, abdominal pain, nausea, melena, diarrhea, constipation, flank pain, dysuria, hematuria, urinary  Frequency, nocturia, numbness, tingling, seizures,  Focal weakness, Loss of consciousness,  Tremor, insomnia, depression, anxiety, and suicidal ideation.      Objective:  BP 122/78   Pulse 72   Temp 98.1 F (36.7 C) (Oral)   Resp 14   Ht 5\' 5"  (1.651 m)   Wt 179 lb (81.2 kg)   LMP 04/06/2016   SpO2 99%   BMI 29.79 kg/m   BP Readings from Last 3 Encounters:  05/13/16 122/78  05/05/16 110/77  03/25/16 108/76    Wt Readings from Last 3 Encounters:  05/13/16 179 lb (81.2 kg)  05/05/16 179 lb 1.6 oz (81.2 kg)  03/25/16 182 lb 8 oz (82.8 kg)    General appearance: alert, cooperative and appears stated age Ears: normal TM's and external ear canals both ears Throat: lips, mucosa, and tongue normal; teeth and gums normal Neck: no adenopathy, no carotid bruit, supple, symmetrical, trachea midline and thyroid not enlarged, symmetric, no tenderness/mass/nodules Back: symmetric, no curvature. ROM normal. No CVA tenderness. Lungs: clear to auscultation  bilaterally Heart: regular rate and rhythm, S1, S2 normal, no murmur, click, rub or gallop Abdomen: soft, non-tender; bowel sounds normal; no masses,  no organomegaly Pulses: 2+ and symmetric Skin: Skin color, texture, turgor normal. No rashes or lesions Lymph nodes: Cervical, supraclavicular, and axillary nodes normal.  Lab Results  Component Value Date   HGBA1C 5.1 03/25/2016   HGBA1C 5.5 03/22/2015   HGBA1C 5.2 07/04/2013    Lab Results  Component Value Date   CREATININE 0.91 03/25/2016   CREATININE 0.71 03/22/2015    CREATININE 0.74 07/04/2013    Lab Results  Component Value Date   WBC 10.2 03/07/2014   HGB 10.7 (L) 03/07/2014   HCT 29.7 (L) 03/09/2014   PLT 220 03/07/2014   GLUCOSE 94 03/25/2016   CHOL 167 03/22/2015   TRIG 127 03/22/2015   HDL 66 03/22/2015   LDLCALC 76 03/22/2015   ALT 27 03/25/2016   AST 27 03/25/2016   NA 139 03/25/2016   K 4.6 03/25/2016   CL 99 03/25/2016   CREATININE 0.91 03/25/2016   BUN 15 03/25/2016   CO2 24 03/25/2016   TSH 2.390 03/25/2016   HGBA1C 5.1 03/25/2016    No results found.  Assessment & Plan:   Problem List Items Addressed This Visit    Acute bronchitis - Primary    IM Dep Medrol given.  Prednisone taper, Levaquin.  Albuterol MDI and Tussionex for cough. [facility]'s sinus rinse advised.  Adding allegra or daily zyrtec,  Stop claritin       Relevant Medications   methylPREDNISolone acetate (DEPO-MEDROL) injection 40 mg (Completed)    Other Visit Diagnoses   None.     I am having Kendra Ramos start on albuterol, predniSONE, chlorpheniramine-HYDROcodone, and levofloxacin. I am also having her maintain her fish oil-omega-3 fatty acids, loratadine, diphenoxylate-atropine, etonogestrel-ethinyl estradiol, clonazePAM, multivitamin, montelukast, cyanocobalamin, phentermine, and Cyanocobalamin. We administered methylPREDNISolone acetate.  Meds ordered this encounter  Medications  . albuterol (PROAIR HFA) 108 (90 Base) MCG/ACT inhaler    Sig: Inhale 2 puffs into the lungs every 6 (six) hours as needed for wheezing or shortness of breath.    Dispense:  6.7 g    Refill:  1  . predniSONE (DELTASONE) 10 MG tablet    Sig: 6 tablets all at once on Day 1,  Then taper by 1 tablet daily until gone    Dispense:  21 tablet    Refill:  0  . chlorpheniramine-HYDROcodone (TUSSIONEX PENNKINETIC ER) 10-8 MG/5ML SUER    Sig: Take 5 mLs by mouth at bedtime as needed for cough.    Dispense:  180 mL    Refill:  0  . levofloxacin (LEVAQUIN) 500 MG tablet    Sig:  Take 1 tablet (500 mg total) by mouth daily.    Dispense:  7 tablet    Refill:  0  . methylPREDNISolone acetate (DEPO-MEDROL) injection 40 mg    There are no discontinued medications.  Follow-up: No Follow-up on file.   Crecencio Mc, MD

## 2016-05-13 NOTE — Progress Notes (Signed)
Pre-visit discussion using our clinic review tool. No additional management support is needed unless otherwise documented below in the visit note.  

## 2016-05-13 NOTE — Patient Instructions (Addendum)
Continue Singulair , generic  zyrtec (10 mg )  or allegra (180 mg ) , and nasonex .  Stop the generic claritin (loratidine)    Levaquin for sinusitis/otitis given the severity and duration of symptoms   Depo Medrol injection today,  Start the  Prednisone taper tomorrow and take it  for 6 days  Tussionex for cough suppression at night  Use Dayquil or Delsym  during the day  Adding albuterol inhaler for the wheezing ,  Cough and chest tightness (2 puffs every 6 hours as needed)   Please take a probiotic ( Align, Floraque or Culturelle), the generic version of one of these over the counter medications, or a PROBIOTIC BEVERAGE  (kombucha,  KEVITA DRINKS AVAILABLE IN THE REFRIGERATED VEGETABLE SECTION OF ANY GROCERY STORE ) Yogurt, or another dietary source) for a minimum of 3 weeks to prevent a serious antibiotic associated diarrhea  Called clostridium dificile colitis.  Taking a probiotic may also prevent vaginitis due to yeast infections and can be continued indefinitely if you feel that it improves your digestion or your elimination (bowels).  You may want to try [facility]'s Sinus rinse ;  It is a stong sinus "flush" using water and medicated salts.  Do it over the sink because it can be a bit messy.  (You will feel a bit like a drowning victim the first time you do it....)   Consider  getting pulmonary function tests down the road when better to rule out asthma

## 2016-05-14 NOTE — Assessment & Plan Note (Signed)
IM Dep Medrol given.  Prednisone taper, Levaquin.  Albuterol MDI and Tussionex for cough. [facility]'s sinus rinse advised.  Adding allegra or daily zyrtec,  Stop claritin

## 2016-06-05 ENCOUNTER — Other Ambulatory Visit: Payer: Self-pay | Admitting: *Deleted

## 2016-06-05 ENCOUNTER — Encounter: Payer: Self-pay | Admitting: Obstetrics and Gynecology

## 2016-06-05 MED ORDER — DIPHENOXYLATE-ATROPINE 2.5-0.025 MG PO TABS: 1.0000 | ORAL_TABLET | Freq: Every day | ORAL | 6 refills | Status: DC

## 2016-06-08 ENCOUNTER — Encounter: Payer: Self-pay | Admitting: Obstetrics and Gynecology

## 2016-07-02 ENCOUNTER — Encounter: Payer: Self-pay | Admitting: Obstetrics and Gynecology

## 2016-07-02 ENCOUNTER — Other Ambulatory Visit: Payer: Self-pay | Admitting: *Deleted

## 2016-07-02 ENCOUNTER — Encounter: Payer: Self-pay | Admitting: Internal Medicine

## 2016-07-02 MED ORDER — MONTELUKAST SODIUM 10 MG PO TABS: 10.0000 mg | ORAL_TABLET | Freq: Every day | ORAL | 3 refills | Status: DC

## 2016-07-06 ENCOUNTER — Other Ambulatory Visit: Payer: Self-pay | Admitting: *Deleted

## 2016-07-06 MED ORDER — LORAZEPAM 0.5 MG PO TABS: 0.5000 mg | ORAL_TABLET | Freq: Three times a day (TID) | ORAL | 2 refills | Status: DC

## 2016-08-12 ENCOUNTER — Other Ambulatory Visit: Payer: Self-pay | Admitting: *Deleted

## 2016-08-12 MED ORDER — MOXIFLOXACIN HCL 0.5 % OP SOLN: 1.0000 [drp] | Freq: Three times a day (TID) | OPHTHALMIC | 1 refills | Status: DC

## 2016-09-07 ENCOUNTER — Encounter: Payer: Self-pay | Admitting: Obstetrics and Gynecology

## 2016-09-08 ENCOUNTER — Ambulatory Visit (INDEPENDENT_AMBULATORY_CARE_PROVIDER_SITE_OTHER): Payer: 59 | Admitting: Family Medicine

## 2016-09-08 ENCOUNTER — Encounter: Payer: Self-pay | Admitting: Internal Medicine

## 2016-09-08 ENCOUNTER — Encounter: Payer: Self-pay | Admitting: Family Medicine

## 2016-09-08 DIAGNOSIS — J988 Other specified respiratory disorders: Secondary | ICD-10-CM

## 2016-09-08 DIAGNOSIS — B9789 Other viral agents as the cause of diseases classified elsewhere: Secondary | ICD-10-CM | POA: Insufficient documentation

## 2016-09-08 MED ORDER — PREDNISONE 50 MG PO TABS: ORAL_TABLET | ORAL | 0 refills | Status: DC

## 2016-09-08 MED ORDER — HYDROCOD POLST-CPM POLST ER 10-8 MG/5ML PO SUER: 5.0000 mL | Freq: Every evening | ORAL | 0 refills | Status: DC | PRN

## 2016-09-08 NOTE — Progress Notes (Signed)
Pre visit review using our clinic review tool, if applicable. No additional management support is needed unless otherwise documented below in the visit note. 

## 2016-09-08 NOTE — Progress Notes (Signed)
   Subjective:  Patient ID: Kendra Ramos, female    DOB: [birth year]  Age: 31 y.o. MRN: [id number]  CC: ST, Cough, Hoarseness  HPI:  31 year old female presents with the above complaints.  Patient reports that she's been sick since Saturday. She's had portions and sore throat. She's also had runny nose. Additionally, she's now developed a severe cough. Worse at night. No known exacerbating or relieving factors. No associated fever or chills. She states that the cough is interfering with sleep. No other complaints or concerns at this time.  Social Hx   Social History   Social History  . Marital status: Married    Spouse name: N/A  . Number of children: N/A  . Years of education: N/A   Social History Main Topics  . Smoking status: Never Smoker  . Smokeless tobacco: Never Used  . Alcohol use 3.5 oz/week    7 Standard drinks or equivalent per week  . Drug use: No  . Sexual activity: Yes    Birth control/ protection: Inserts   Other Topics Concern  . None   Social History Narrative  . None    Review of Systems  HENT: Positive for sore throat and voice change.   Respiratory: Positive for cough.    Objective:  BP 108/73   Pulse 68   Temp 98.4 F (36.9 C) (Oral)   Wt 180 lb 9.6 oz (81.9 kg)   SpO2 100%   BMI 30.05 kg/m   BP/Weight 09/08/2016 05/13/2016 [id number]  Systolic BP 123XX123 [id number]  Diastolic BP 73 78 77  Wt. (Lbs) 180.6 179 179.1  BMI 30.05 29.79 29.8   Physical Exam  Constitutional: She is oriented to person, place, and time. She appears well-developed. No distress.  HENT:  Mouth/Throat: Oropharynx is clear and moist.  Cardiovascular: Normal rate and regular rhythm.   Pulmonary/Chest: Effort normal and breath sounds normal.  Neurological: She is alert and oriented to person, place, and time.  Psychiatric: She has a normal mood and affect.  Vitals reviewed.  Lab Results  Component Value Date   WBC 10.2 03/07/2014   HGB 10.7 (L) 03/07/2014   HCT 29.7  (L) 03/09/2014   PLT 220 03/07/2014   GLUCOSE 94 03/25/2016   CHOL 167 03/22/2015   TRIG 127 03/22/2015   HDL 66 03/22/2015   LDLCALC 76 03/22/2015   ALT 27 03/25/2016   AST 27 03/25/2016   NA 139 03/25/2016   K 4.6 03/25/2016   CL 99 03/25/2016   CREATININE 0.91 03/25/2016   BUN 15 03/25/2016   CO2 24 03/25/2016   TSH 2.390 03/25/2016   HGBA1C 5.1 03/25/2016    Assessment & Plan:   Problem List Items Addressed This Visit    Viral respiratory illness    New problem. Treating with Tussionex and prednisone.         Meds ordered this encounter  Medications  . chlorpheniramine-HYDROcodone (TUSSIONEX PENNKINETIC ER) 10-8 MG/5ML SUER    Sig: Take 5 mLs by mouth at bedtime as needed for cough.    Dispense:  180 mL    Refill:  0  . predniSONE (DELTASONE) 50 MG tablet    Sig: 1 tablet daily x 5 days.    Dispense:  5 tablet    Refill:  0    Follow-up: PRN  Bryant

## 2016-09-08 NOTE — Assessment & Plan Note (Signed)
New problem. Treating with Tussionex and prednisone.

## 2016-09-08 NOTE — Patient Instructions (Signed)
Cough medication and tussionex as prescribed.  This is viral and will slowly improve.  Call with concerns.  Take care  Dr. Lacinda Axon

## 2016-11-02 ENCOUNTER — Encounter: Payer: Self-pay | Admitting: Internal Medicine

## 2016-11-30 ENCOUNTER — Ambulatory Visit (INDEPENDENT_AMBULATORY_CARE_PROVIDER_SITE_OTHER): Payer: 59

## 2016-11-30 ENCOUNTER — Encounter: Payer: Self-pay | Admitting: Family

## 2016-11-30 ENCOUNTER — Ambulatory Visit (INDEPENDENT_AMBULATORY_CARE_PROVIDER_SITE_OTHER): Payer: 59 | Admitting: Family

## 2016-11-30 VITALS — BP 128/66 | HR 83 | Temp 98.7°F | Ht 65.0 in | Wt 174.9 lb

## 2016-11-30 DIAGNOSIS — J40 Bronchitis, not specified as acute or chronic: Secondary | ICD-10-CM | POA: Diagnosis not present

## 2016-11-30 MED ORDER — HYDROCOD POLST-CPM POLST ER 10-8 MG/5ML PO SUER: 5.0000 mL | Freq: Every evening | ORAL | 0 refills | Status: DC | PRN

## 2016-11-30 MED ORDER — PREDNISONE 10 MG PO TABS: ORAL_TABLET | ORAL | 0 refills | Status: DC

## 2016-11-30 MED ORDER — BUDESONIDE-FORMOTEROL FUMARATE 80-4.5 MCG/ACT IN AERO: 2.0000 | INHALATION_SPRAY | Freq: Two times a day (BID) | RESPIRATORY_TRACT | 1 refills | Status: DC

## 2016-11-30 MED ORDER — ALBUTEROL SULFATE (2.5 MG/3ML) 0.083% IN NEBU
2.5000 mg | INHALATION_SOLUTION | Freq: Once | RESPIRATORY_TRACT | Status: AC
  Administered 2016-11-30: 2.5 mg via RESPIRATORY_TRACT

## 2016-11-30 NOTE — Patient Instructions (Addendum)
Trial symbicort for one month; follow up with Tullo if would like to stay on medication.   Referral to allergist  Chest xray  Use albuterol every 6 hours for first 24 hours to get good medication into the lungs and loosen congestion; after, you may use as needed and eventually stop all together when cough resolves.  Please take cough medication at night only as needed. As we discussed, I do not recommend dosing throughout the day as coughing is a protective mechanism . It also helps to break up thick mucous.  Do not take cough suppressants with alcohol as can lead to trouble breathing. Advise caution if taking cough suppressant and operating machinery ( i.e driving a car) as you may feel very tired.     Let me know if not better

## 2016-11-30 NOTE — Progress Notes (Signed)
Subjective:    Patient ID: Kendra Ramos, female    DOB: 1985/11/22, 31 y.o.   MRN: [id number]  CC: Kendra Ramos is a 31 y.o. female who presents today for an acute visit.    HPI: CC: cough x one week, worsening.    Endorses sob 'during coughing fits' , occasional wheezing, thin runny nasal discharge. Tried inhaler and not much relief.  Ran out of tussionex which helped this past week. Takes singular and nasal rinse with some relief. No fever, HA. ,,                                        h/o seasonal allergies.     seen 09/09/2015 for viral URI and  Given prednisone  HISTORY:  Past Medical History:  Diagnosis Date  . Anxiety   . Contraception   . Headache   . Vaginal Pap smear, abnormal    No past surgical history on file. Family History  Problem Relation Age of Onset  . Diabetes Mother   . Cancer Mother     skin ca  . Diabetes Father   . Diabetes Maternal Grandmother   . Diabetes Maternal Grandfather   . Diabetes Paternal Grandmother   . Diabetes Paternal Grandfather     Allergies: Patient has no known allergies. Current Outpatient Prescriptions on File Prior to Visit  Medication Sig Dispense Refill  . albuterol (PROAIR HFA) 108 (90 Base) MCG/ACT inhaler Inhale 2 puffs into the lungs every 6 (six) hours as needed for wheezing or shortness of breath. 6.7 g 1  . clonazePAM (KLONOPIN) 0.5 MG tablet Take 1 tablet (0.5 mg total) by mouth 2 (two) times daily as needed for anxiety. 90 tablet 1  . cyanocobalamin (,VITAMIN B-12,) 1000 MCG/ML injection Inject 1 mL (1,000 mcg total) into the muscle once. 10 mL 1  . Cyanocobalamin 1000 MCG SUBL Place 1 tablet (1,000 mcg total) under the tongue daily. 90 tablet 3  . diphenoxylate-atropine (LOMOTIL) 2.5-0.025 MG tablet Take 1 tablet by mouth daily. Reported on 12/05/2015 90 tablet 6  . etonogestrel-ethinyl estradiol (NUVARING) 0.12-0.015 MG/24HR vaginal ring Insert vaginally and leave in place for 3 consecutive weeks, then  remove for 1 week. 3 each 4  . fish oil-omega-3 fatty acids 1000 MG capsule Take 2 g by mouth daily.    Marland Kitchen loratadine (CLARITIN) 10 MG tablet Take 10 mg by mouth daily.    Marland Kitchen LORazepam (ATIVAN) 0.5 MG tablet Take 1 tablet (0.5 mg total) by mouth every 8 (eight) hours. 30 tablet 2  . montelukast (SINGULAIR) 10 MG tablet Take 1 tablet (10 mg total) by mouth at bedtime. 90 tablet 3  . moxifloxacin (VIGAMOX) 0.5 % ophthalmic solution Place 1 drop into both eyes 3 (three) times daily. 3 mL 1  . Multiple Vitamin (MULTIVITAMIN) capsule Take 1 capsule by mouth daily.    . phentermine (ADIPEX-P) 37.5 MG tablet Take 1 tablet (37.5 mg total) by mouth daily before breakfast. 30 tablet 2   No current facility-administered medications on file prior to visit.     Social History  Substance Use Topics  . Smoking status: Never Smoker  . Smokeless tobacco: Never Used  . Alcohol use 3.5 oz/week    7 Standard drinks or equivalent per week    Review of Systems  Constitutional: Negative for chills and fever.  HENT: Positive for congestion, ear pain,  rhinorrhea and sinus pressure.   Respiratory: Positive for cough, shortness of breath and wheezing.   Cardiovascular: Negative for chest pain and palpitations.  Gastrointestinal: Negative for nausea and vomiting.      Objective:    BP 128/66   Pulse 83   Temp 98.7 F (37.1 C) (Oral)   Ht 5\' 5"  (1.651 m)   Wt 174 lb 13.9 oz (79.3 kg)   SpO2 95%   BMI 29.10 kg/m    Physical Exam  Constitutional: She appears well-developed and well-nourished.  HENT:  Head: Normocephalic and atraumatic.  Right Ear: Hearing, tympanic membrane, external ear and ear canal normal. No drainage, swelling or tenderness. No foreign bodies. Tympanic membrane is not erythematous and not bulging. No middle ear effusion. No decreased hearing is noted.  Left Ear: Hearing, tympanic membrane, external ear and ear canal normal. No drainage, swelling or tenderness. No foreign bodies.  Tympanic membrane is not erythematous and not bulging.  No middle ear effusion. No decreased hearing is noted.  Nose: Rhinorrhea present. Right sinus exhibits no maxillary sinus tenderness and no frontal sinus tenderness. Left sinus exhibits no maxillary sinus tenderness and no frontal sinus tenderness.  Mouth/Throat: Uvula is midline, oropharynx is clear and moist and mucous membranes are normal. No oropharyngeal exudate, posterior oropharyngeal edema, posterior oropharyngeal erythema or tonsillar abscesses.  Eyes: Conjunctivae are normal.  Cardiovascular: Regular rhythm, normal heart sounds and normal pulses.   Pulmonary/Chest: Effort normal. She has no wheezes. She has no rhonchi. She has rales in the right lower field.  Lymphadenopathy:       Head (right side): No submental, no submandibular, no tonsillar, no preauricular, no posterior auricular and no occipital adenopathy present.       Head (left side): No submental, no submandibular, no tonsillar, no preauricular, no posterior auricular and no occipital adenopathy present.    She has no cervical adenopathy.  Neurological: She is alert.  Skin: Skin is warm and dry.  Psychiatric: She has a normal mood and affect. Her speech is normal and behavior is normal. Thought content normal.  Vitals reviewed.  Patient felt significantly better after albuterol treatment. Lung sounds clear and increased     Assessment & Plan:  1. Bronchitis Viral v. Allergic etiology. Discussed with patient my suspicion of asthma - at least seasonally. Discussed trial of symbicort and referral to allergist due to frequent nature of symptoms.  Reassured as improved with nebulizer. Afebrile. No acute respiratory distress and patient is well appearing. Pending cxr to ensure no bacterial etiology in context of adventitious lung sounds. Will let me know if not better.   - chlorpheniramine-HYDROcodone (TUSSIONEX PENNKINETIC ER) 10-8 MG/5ML SUER; Take 5 mLs by mouth at  bedtime as needed for cough.  Dispense: 180 mL; Refill: 0 - Ambulatory referral to Allergy - albuterol (PROVENTIL) (2.5 MG/3ML) 0.083% nebulizer solution 2.5 mg; Take 3 mLs (2.5 mg total) by nebulization once. - budesonide-formoterol (SYMBICORT) 80-4.5 MCG/ACT inhaler; Inhale 2 puffs into the lungs 2 (two) times daily.  Dispense: 1 Inhaler; Refill: 1 - predniSONE (DELTASONE) 10 MG tablet; Take 4 tablets ( total 40 mg) by mouth for 2 days; take 3 tablets ( total 30 mg) by mouth for 2 days; take 2 tablets ( total 20 mg) by mouth for 1 day; take 1 tablet ( total 10 mg) by mouth for 1 day.  Dispense: 17 tablet; Refill: 0 - DG Chest 2 View    I have discontinued Ms. Ortez's predniSONE. I am also having  her start on predniSONE. Additionally, I am having her maintain her fish oil-omega-3 fatty acids, loratadine, etonogestrel-ethinyl estradiol, clonazePAM, multivitamin, cyanocobalamin, phentermine, Cyanocobalamin, albuterol, diphenoxylate-atropine, montelukast, LORazepam, moxifloxacin, chlorpheniramine-HYDROcodone, and budesonide-formoterol. We administered albuterol.   Meds ordered this encounter  Medications  . DISCONTD: budesonide-formoterol (SYMBICORT) 80-4.5 MCG/ACT inhaler    Sig: Inhale 2 puffs into the lungs 2 (two) times daily.    Dispense:  1 Inhaler    Refill:  1    Order Specific Question:   Supervising Provider    Answer:   Derrel Nip, TERESA L [[id number]]  . chlorpheniramine-HYDROcodone (TUSSIONEX PENNKINETIC ER) 10-8 MG/5ML SUER    Sig: Take 5 mLs by mouth at bedtime as needed for cough.    Dispense:  180 mL    Refill:  0    Order Specific Question:   Supervising Provider    Answer:   Deborra Medina L [[id number]]  . albuterol (PROVENTIL) (2.5 MG/3ML) 0.083% nebulizer solution 2.5 mg  . budesonide-formoterol (SYMBICORT) 80-4.5 MCG/ACT inhaler    Sig: Inhale 2 puffs into the lungs 2 (two) times daily.    Dispense:  1 Inhaler    Refill:  1    Order Specific Question:   Supervising Provider     Answer:   Derrel Nip, TERESA L [[id number]]  . predniSONE (DELTASONE) 10 MG tablet    Sig: Take 4 tablets ( total 40 mg) by mouth for 2 days; take 3 tablets ( total 30 mg) by mouth for 2 days; take 2 tablets ( total 20 mg) by mouth for 1 day; take 1 tablet ( total 10 mg) by mouth for 1 day.    Dispense:  17 tablet    Refill:  0    Order Specific Question:   Supervising Provider    Answer:   Crecencio Mc [[id number]]    Return precautions given.   Risks, benefits, and alternatives of the medications and treatment plan prescribed today were discussed, and patient expressed understanding.   Education regarding symptom management and diagnosis given to patient on AVS.  Continue to follow with TULLO, Aris Everts, MD for routine health maintenance.   Kendra Ramos and I agreed with plan.   Mable Paris, FNP

## 2016-11-30 NOTE — Progress Notes (Signed)
nebPre visit review using our clinic review tool, if applicable. No additional management support is needed unless otherwise documented below in the visit note.

## 2016-12-02 ENCOUNTER — Encounter: Payer: Self-pay | Admitting: Family

## 2016-12-03 ENCOUNTER — Encounter (INDEPENDENT_AMBULATORY_CARE_PROVIDER_SITE_OTHER): Payer: Self-pay | Admitting: Vascular Surgery

## 2016-12-03 ENCOUNTER — Ambulatory Visit (INDEPENDENT_AMBULATORY_CARE_PROVIDER_SITE_OTHER): Payer: 59 | Admitting: Vascular Surgery

## 2016-12-03 VITALS — BP 107/64 | HR 62 | Resp 21 | Ht 65.0 in | Wt 174.0 lb

## 2016-12-03 DIAGNOSIS — I83813 Varicose veins of bilateral lower extremities with pain: Secondary | ICD-10-CM | POA: Diagnosis not present

## 2016-12-03 DIAGNOSIS — I872 Venous insufficiency (chronic) (peripheral): Secondary | ICD-10-CM | POA: Insufficient documentation

## 2016-12-03 DIAGNOSIS — J449 Chronic obstructive pulmonary disease, unspecified: Secondary | ICD-10-CM | POA: Diagnosis not present

## 2016-12-03 DIAGNOSIS — F411 Generalized anxiety disorder: Secondary | ICD-10-CM

## 2016-12-03 NOTE — Progress Notes (Signed)
MRN : [id number]  Kendra Ramos is a 31 y.o. (11-26-1985) female who presents with chief complaint of  Chief Complaint  Patient presents with  . Leg Pain    Right leg pain throbbing and itching  .  History of Present Illness: The patient is seen for evaluation of symptomatic varicose veins, right leg more than left. The patient relates burning and stinging which worsened steadily throughout the course of the day, particularly with standing. The patient also notes an aching and throbbing pain over the varicosities, particularly with prolonged dependent positions. The symptoms are significantly improved with elevation.  The patient also notes that during hot weather the symptoms are greatly intensified. The patient states the pain from the varicose veins interferes with work, daily exercise, shopping and household maintenance. At this point, the symptoms are persistent and severe enough that they're having a negative impact on lifestyle and are interfering with daily activities.  There is no history of DVT, PE or superficial thrombophlebitis. There is no history of ulceration or hemorrhage. The patient denies a significant family history of varicose veins. OB history: P1  The patient has not worn graduated compression in the past. At the present time the patient has not been using over-the-counter analgesics. There is no history of prior surgical intervention or sclerotherapy.   No outpatient prescriptions have been marked as taking for the 12/03/16 encounter (Office Visit) with Katha Cabal, MD.    Past Medical History:  Diagnosis Date  . Anxiety   . Contraception   . Headache   . Vaginal Pap smear, abnormal     No past surgical history on file.  Social History Social History  Substance Use Topics  . Smoking status: Never Smoker  . Smokeless tobacco: Never Used  . Alcohol use 3.5 oz/week    7 Standard drinks or equivalent per week    Family History Family History    Problem Relation Age of Onset  . Diabetes Mother   . Cancer Mother     skin ca  . Diabetes Father   . Diabetes Maternal Grandmother   . Diabetes Maternal Grandfather   . Diabetes Paternal Grandmother   . Diabetes Paternal Grandfather   No family history of bleeding/clotting disorders, porphyria or autoimmune disease   No Known Allergies   REVIEW OF SYSTEMS (Negative unless checked)  Constitutional: [] Weight loss  [] Fever  [] Chills Cardiac: [] Chest pain   [] Chest pressure   [] Palpitations   [] Shortness of breath when laying flat   [] Shortness of breath with exertion. Vascular:  [] Pain in legs with walking   [x] Pain in legs with standing  [] History of DVT   [] Phlebitis   [] Swelling in legs   [x] Varicose veins   [] Non-healing ulcers Pulmonary:   [] Uses home oxygen   [] Productive cough   [] Hemoptysis   [] Wheeze  [] COPD   [] Asthma Neurologic:  [] Dizziness   [] Seizures   [] History of stroke   [] History of TIA  [] Aphasia   [] Vissual changes   [] Weakness or numbness in arm   [] Weakness or numbness in leg Musculoskeletal:   [] Joint swelling   [] Joint pain   [] Low back pain Hematologic:  [] Easy bruising  [] Easy bleeding   [] Hypercoagulable state   [] Anemic Gastrointestinal:  [] Diarrhea   [] Vomiting  [] Gastroesophageal reflux/heartburn   [] Difficulty swallowing. Genitourinary:  [] Chronic kidney disease   [] Difficult urination  [] Frequent urination   [] Blood in urine Skin:  [] Rashes   [] Ulcers  Psychological:  [] History of anxiety   []   History of major depression.  Physical Examination  Vitals:   12/03/16 1106  BP: 107/64  Pulse: 62  Resp: (!) 21  Weight: 174 lb (78.9 kg)  Height: 5\' 5"  (1.651 m)   Body mass index is 28.96 kg/m. Gen: WD/WN, NAD Head: /AT, No temporalis wasting.  Ear/Nose/Throat: Hearing grossly intact, nares w/o erythema or drainage Eyes: PER, EOMI, sclera nonicteric.  Neck: Supple, no large masses.   Pulmonary:  Good air movement, no audible wheezing  bilaterally, no use of accessory muscles.  Cardiac: RRR, no JVD Vascular: scattered varicosities present  Right leg more than left.  5-6 mm in diamter.  No venous stasis changes noted trace soft  edema Vessel Right Left  PT Palpable Palpable  DP Palpable Palpable  Gastrointestinal: Non-distended. No guarding/no peritoneal signs.  Musculoskeletal: M/S 5/5 throughout.  No deformity or atrophy.  Neurologic: CN 2-12 intact. Symmetrical.  Speech is fluent. Motor exam as listed above. Psychiatric: Judgment intact, Mood & affect appropriate for pt's clinical situation. Dermatologic: No rashes or ulcers noted.  No changes consistent with cellulitis. Lymph : No lichenification or skin changes of chronic lymphedema.  CBC Lab Results  Component Value Date   WBC 10.2 03/07/2014   HGB 10.7 (L) 03/07/2014   HCT 29.7 (L) 03/09/2014   MCV 88 03/07/2014   PLT 220 03/07/2014    BMET    Component Value Date/Time   NA 139 03/25/2016 1517   K 4.6 03/25/2016 1517   CL 99 03/25/2016 1517   CO2 24 03/25/2016 1517   GLUCOSE 94 03/25/2016 1517   BUN 15 03/25/2016 1517   CREATININE 0.91 03/25/2016 1517   CALCIUM 9.6 03/25/2016 1517   GFRNONAA 84 03/25/2016 1517   GFRAA 97 03/25/2016 1517   CrCl cannot be calculated (Patient's most recent lab result is older than the maximum 21 days allowed.).  COAG No results found for: INR, PROTIME  Radiology Dg Chest 2 View  Result Date: 11/30/2016 CLINICAL DATA:  Cough for 1 week. EXAM: CHEST  2 VIEW COMPARISON:  None. FINDINGS: Trachea is midline. Heart size normal. Lungs are clear. No pleural fluid. Mild pectus deformity. IMPRESSION: No acute findings. Electronically Signed   By: Lorin Picket M.D.   On: 11/30/2016 13:17    Assessment/Plan 1. Varicose veins of bilateral lower extremities with pain  Recommend:  The patient has large symptomatic varicose veins that are painful and associated with swelling.  I have had a long discussion with the  patient regarding  varicose veins and why they cause symptoms.  Patient will begin wearing graduated compression stockings class 1 on a daily basis, beginning first thing in the morning and removing them in the evening. The patient is instructed specifically not to sleep in the stockings.    The patient  will also begin using over-the-counter analgesics such as Motrin 600 mg po TID to help control the symptoms.    In addition, behavioral modification including elevation during the day will be initiated.    Pending the results of these changes the  patient will be reevaluated in three months.   An  ultrasound of the venous system will be obtained.   Further plans will be based on the ultrasound results and whether conservative therapies are successful at eliminating the pain and swelling.   2. Chronic venous insufficiency See #1  3. COPD mixed type ([place]) Continue pulmonary medications and aerosols as already ordered, these medications have been reviewed and there are no changes at this time.  4. Generalized anxiety disorder Continue Xanax as already ordered, these medications have been reviewed and there are no changes at this time.     Hortencia Pilar, MD  12/03/2016 11:11 AM

## 2016-12-07 ENCOUNTER — Ambulatory Visit (INDEPENDENT_AMBULATORY_CARE_PROVIDER_SITE_OTHER): Payer: 59 | Admitting: Family Medicine

## 2016-12-07 ENCOUNTER — Encounter: Payer: Self-pay | Admitting: Family Medicine

## 2016-12-07 DIAGNOSIS — J209 Acute bronchitis, unspecified: Secondary | ICD-10-CM

## 2016-12-07 MED ORDER — DOXYCYCLINE HYCLATE 100 MG PO TABS: 100.0000 mg | ORAL_TABLET | Freq: Two times a day (BID) | ORAL | 0 refills | Status: DC

## 2016-12-07 NOTE — Progress Notes (Signed)
Subjective:  Patient ID: Kendra Ramos, female    DOB: [birth year]  Age: 31 y.o. MRN: [id number]  CC: Cough/Bronchitis  HPI:  31 year old female presents with the above complaints.  Patient was recently seen for this on 4/30. She was diagnosed with acute bronchitis. Chest x-ray negative. She was treated with tussionex and prednisone.  Patient has completed a course of prednisone. She continues to feel poorly. She has a severe, dry cough. No associated shortness of breath associated with a cough. Additionally, she been taking Symbicort as prescribed and using albuterol as well without improvement. No associated fevers or chills. No other associated symptoms. No other complaints at this time.  Social Hx   Social History   Social History  . Marital status: Married    Spouse name: N/A  . Number of children: N/A  . Years of education: N/A   Social History Main Topics  . Smoking status: Never Smoker  . Smokeless tobacco: Never Used  . Alcohol use 3.5 oz/week    7 Standard drinks or equivalent per week  . Drug use: No  . Sexual activity: Yes    Birth control/ protection: Inserts   Other Topics Concern  . None   Social History Narrative  . None    Review of Systems  Constitutional: Positive for fatigue. Negative for chills and fever.  Respiratory: Positive for cough and shortness of breath.    Objective:  BP 120/74   Pulse 81   Temp 98.3 F (36.8 C) (Oral)   Wt 178 lb 6 oz (80.9 kg)   LMP 11/23/2016 (Approximate)   SpO2 97%   BMI 29.68 kg/m   BP/Weight 12/07/2016 12/03/2016 11/09/8117  Systolic BP 147 829 562  Diastolic BP 74 64 66  Wt. (Lbs) 178.38 174 174.87  BMI 29.68 28.96 29.1   Physical Exam  Constitutional: She is oriented to person, place, and time.  Appears fatigued but in NAD.   HENT:  Head: Normocephalic and atraumatic.  Eyes: Conjunctivae are normal.  Cardiovascular: Normal rate and regular rhythm.   Pulmonary/Chest: Effort normal and breath sounds  normal. She has no wheezes. She has no rales.  Neurological: She is alert and oriented to person, place, and time.  Vitals reviewed.   Lab Results  Component Value Date   WBC 10.2 03/07/2014   HGB 10.7 (L) 03/07/2014   HCT 29.7 (L) 03/09/2014   PLT 220 03/07/2014   GLUCOSE 94 03/25/2016   CHOL 167 03/22/2015   TRIG 127 03/22/2015   HDL 66 03/22/2015   LDLCALC 76 03/22/2015   ALT 27 03/25/2016   AST 27 03/25/2016   NA 139 03/25/2016   K 4.6 03/25/2016   CL 99 03/25/2016   CREATININE 0.91 03/25/2016   BUN 15 03/25/2016   CO2 24 03/25/2016   TSH 2.390 03/25/2016   HGBA1C 5.1 03/25/2016    Assessment & Plan:   Problem List Items Addressed This Visit    Subacute bronchitis    Established problem, worsening. Given persistent symptoms, we are proceeding with a trial of doxycycline. She fails to improve, she will need to see pulmonology.         Meds ordered this encounter  Medications  . doxycycline (VIBRA-TABS) 100 MG tablet    Sig: Take 1 tablet (100 mg total) by mouth 2 (two) times daily.    Dispense:  14 tablet    Refill:  0    Follow-up: PRN  [place]

## 2016-12-07 NOTE — Progress Notes (Signed)
Pre visit review using our clinic review tool, if applicable. No additional management support is needed unless otherwise documented below in the visit note. 

## 2016-12-07 NOTE — Assessment & Plan Note (Signed)
Established problem, worsening. Given persistent symptoms, we are proceeding with a trial of doxycycline. She fails to improve, she will need to see pulmonology.

## 2016-12-07 NOTE — Patient Instructions (Signed)
Antibiotic as prescribed.  Let us know if you fail to improve or worsen.  Take care  Dr. Lacinda Axon

## 2016-12-08 ENCOUNTER — Other Ambulatory Visit: Payer: Self-pay | Admitting: Family Medicine

## 2016-12-08 DIAGNOSIS — J209 Acute bronchitis, unspecified: Secondary | ICD-10-CM

## 2016-12-08 NOTE — Telephone Encounter (Signed)
Pt is now wanting a PFT and no longer wanting the ENT. Can you enter an order for PFT. I will tell her to call to schedule this. They now require two orders for PFT. One with the methacholine challenge and one without it. Thank you, Lenna Sciara

## 2016-12-11 ENCOUNTER — Other Ambulatory Visit: Payer: Self-pay

## 2016-12-11 MED ORDER — ALBUTEROL SULFATE HFA 108 (90 BASE) MCG/ACT IN AERS: 2.0000 | INHALATION_SPRAY | Freq: Four times a day (QID) | RESPIRATORY_TRACT | 1 refills | Status: DC | PRN

## 2016-12-11 NOTE — Telephone Encounter (Signed)
Refilled: 05/13/2016 Last OV: 05/13/2016 Next OV: not scheduled

## 2016-12-14 ENCOUNTER — Other Ambulatory Visit: Payer: Self-pay

## 2016-12-14 MED ORDER — ALBUTEROL SULFATE HFA 108 (90 BASE) MCG/ACT IN AERS: 2.0000 | INHALATION_SPRAY | Freq: Four times a day (QID) | RESPIRATORY_TRACT | 2 refills | Status: DC | PRN

## 2016-12-16 ENCOUNTER — Telehealth: Payer: 59 | Admitting: Family

## 2016-12-16 DIAGNOSIS — R059 Cough, unspecified: Secondary | ICD-10-CM

## 2016-12-16 DIAGNOSIS — J40 Bronchitis, not specified as acute or chronic: Secondary | ICD-10-CM

## 2016-12-16 DIAGNOSIS — R05 Cough: Secondary | ICD-10-CM

## 2016-12-16 NOTE — Progress Notes (Signed)
Based on what you shared with me it looks like you have a serious condition that should be evaluated in a face to face office visit.  NOTE: Even if you have entered your credit card information for this eVisit, you will not be charged.   If you are having a true medical emergency please call 911.  If you need an urgent face to face visit, [facility] has four urgent care centers for your convenience.  If you need care fast and have a high deductible or no insurance consider:   [email or web address]  [phone]  [address], [address] [place], [place] [address] 8 am to 8 pm Monday-Friday 10 am to 4 pm Saturday-Sunday   The following sites will take your  insurance:    . [facility]  [phone] Get Driving Directions Find a Provider at this Location  [address] [place], [place] [address] . 10 am to 8 pm Monday-Friday . 12 pm to 8 pm Saturday-Sunday   . [facility]  [phone] Get Driving Directions Find a Provider at this Location  [address], [address] [place], [place] [address] . 8 am to 8 pm Monday-Friday . 9 am to 6 pm Saturday . 11 am to 6 pm Sunday   . [facility] [place]  [phone] Get Driving Directions  [address] [place], [place] [address] . 8 am to 8 pm Monday-Friday . 8 am to 4 pm Saturday-Sunday   Your e-visit answers were reviewed by a board certified advanced clinical practitioner to complete your personal care plan.  Thank you for using e-Visits.  

## 2016-12-30 ENCOUNTER — Encounter: Payer: Self-pay | Admitting: Internal Medicine

## 2017-01-07 ENCOUNTER — Ambulatory Visit: Payer: 59 | Admitting: Internal Medicine

## 2017-01-27 ENCOUNTER — Encounter: Payer: Self-pay | Admitting: Obstetrics and Gynecology

## 2017-01-28 ENCOUNTER — Ambulatory Visit (INDEPENDENT_AMBULATORY_CARE_PROVIDER_SITE_OTHER): Payer: 59

## 2017-01-28 ENCOUNTER — Ambulatory Visit (INDEPENDENT_AMBULATORY_CARE_PROVIDER_SITE_OTHER): Payer: 59 | Admitting: Vascular Surgery

## 2017-01-28 ENCOUNTER — Encounter (INDEPENDENT_AMBULATORY_CARE_PROVIDER_SITE_OTHER): Payer: Self-pay | Admitting: Vascular Surgery

## 2017-01-28 VITALS — BP 108/70 | HR 55 | Resp 16 | Wt 177.8 lb

## 2017-01-28 DIAGNOSIS — I83813 Varicose veins of bilateral lower extremities with pain: Secondary | ICD-10-CM

## 2017-01-28 DIAGNOSIS — I872 Venous insufficiency (chronic) (peripheral): Secondary | ICD-10-CM | POA: Diagnosis not present

## 2017-01-29 ENCOUNTER — Encounter: Payer: Self-pay | Admitting: Obstetrics and Gynecology

## 2017-01-29 ENCOUNTER — Ambulatory Visit (INDEPENDENT_AMBULATORY_CARE_PROVIDER_SITE_OTHER): Payer: 59 | Admitting: Obstetrics and Gynecology

## 2017-01-29 VITALS — BP 113/82 | HR 61 | Ht 65.0 in | Wt 177.4 lb

## 2017-01-29 DIAGNOSIS — E663 Overweight: Secondary | ICD-10-CM | POA: Diagnosis not present

## 2017-01-29 DIAGNOSIS — R5383 Other fatigue: Secondary | ICD-10-CM | POA: Diagnosis not present

## 2017-01-29 MED ORDER — CYANOCOBALAMIN 1000 MCG/ML IJ SOLN: 1000.0000 ug | Freq: Once | INTRAMUSCULAR | 1 refills | Status: AC

## 2017-01-29 MED ORDER — PHENTERMINE HCL 37.5 MG PO TABS: 37.5000 mg | ORAL_TABLET | Freq: Every day | ORAL | 2 refills | Status: DC

## 2017-01-29 NOTE — Progress Notes (Signed)
SUBJECTIVE:  31 y.o. here for follow-up weight loss visit. Denies any concerns except fatigue and feels like medication is not working anymore and that she has leveled off. Did Keto diet a few months ago and lost 6 lbs, and felt a little better. Now feels really fatigued. Not on any new medications that would cause fatigue.felt more energized when taking adipex & B12.  OBJECTIVE:  BP 113/82   Pulse 61   Ht 5\' 5"  (1.651 m)   Wt 177 lb 6.4 oz (80.5 kg)   BMI 29.52 kg/m   Body mass index is 29.52 kg/m. Patient appears well.  ASSESSMENT:  Overweight Fatigue  PLAN:  To restart weight medications. B12 1043mcg/ml injection given, labs obtained to rule out vitamin deficiencies. RTC in 4 weeks as planned  Radford [place], CNM

## 2017-01-30 LAB — COMPREHENSIVE METABOLIC PANEL
A/G RATIO: 1.8 (ref 1.2–2.2)
ALT: 14 IU/L (ref 0–32)
AST: 20 IU/L (ref 0–40)
Albumin: 4.1 g/dL (ref 3.5–5.5)
Alkaline Phosphatase: 37 IU/L — ABNORMAL LOW (ref 39–117)
BUN/Creatinine Ratio: 19 (ref 9–23)
BUN: 16 mg/dL (ref 6–20)
CALCIUM: 9.2 mg/dL (ref 8.7–10.2)
CHLORIDE: 102 mmol/L (ref 96–106)
CO2: 22 mmol/L (ref 20–29)
Creatinine, Ser: 0.83 mg/dL (ref 0.57–1.00)
GFR calc Af Amer: 107 mL/min/{1.73_m2} (ref >59)
GFR, EST NON AFRICAN AMERICAN: 93 mL/min/{1.73_m2} (ref >59)
GLUCOSE: 91 mg/dL (ref 65–99)
Globulin, Total: 2.3 g/dL (ref 1.5–4.5)
POTASSIUM: 3.9 mmol/L (ref 3.5–5.2)
Sodium: 139 mmol/L (ref 134–144)
Total Protein: 6.4 g/dL (ref 6.0–8.5)

## 2017-01-30 LAB — VITAMIN D 25 HYDROXY (VIT D DEFICIENCY, FRACTURES): Vit D, 25-Hydroxy: 43 ng/mL (ref 30.0–100.0)

## 2017-01-30 LAB — B12 AND FOLATE PANEL
FOLATE: 7.6 ng/mL (ref >3.0)
VITAMIN B 12: 472 pg/mL (ref 232–1245)

## 2017-01-30 LAB — CBC
HEMOGLOBIN: 12 g/dL (ref 11.1–15.9)
Hematocrit: 35.9 % (ref 34.0–46.6)
MCH: 27.5 pg (ref 26.6–33.0)
MCHC: 33.4 g/dL (ref 31.5–35.7)
MCV: 82 fL (ref 79–97)
Platelets: 295 10*3/uL (ref 150–379)
RBC: 4.36 x10E6/uL (ref 3.77–5.28)
RDW: 16.7 % — ABNORMAL HIGH (ref 12.3–15.4)
WBC: 6.6 10*3/uL (ref 3.4–10.8)

## 2017-01-30 LAB — TSH: TSH: 2 u[IU]/mL (ref 0.450–4.500)

## 2017-01-30 LAB — FERRITIN: FERRITIN: 5 ng/mL — AB (ref 15–150)

## 2017-01-30 LAB — MAGNESIUM: Magnesium: 1.8 mg/dL (ref 1.6–2.3)

## 2017-01-31 NOTE — Progress Notes (Signed)
MRN : [id number]  Kendra Ramos is a 31 y.o. (August 17, 1985) female who presents with chief complaint of  Chief Complaint  Patient presents with  . Follow-up  .  History of Present Illness: The patient returns for followup evaluation  after the initial visit. The patient continues to have pain in the lower extremities with dependency. The pain is lessened with elevation. Graduated compression stockings, Class I (20-30 mmHg), have been worn but the stockings do not eliminate the leg pain. Over-the-counter analgesics do not improve the symptoms. The degree of discomfort continues to interfere with daily activities. The patient notes the pain in the legs is causing problems with daily exercise, at the workplace and even with household activities and maintenance such as standing in the kitchen preparing meals and doing dishes.   Venous ultrasound shows normal deep venous system, no evidence of acute or chronic DVT.  Superficial reflux is not present in the superficial system   Current Meds  Medication Sig  . albuterol (PROAIR HFA) 108 (90 Base) MCG/ACT inhaler Inhale 2 puffs into the lungs every 6 (six) hours as needed for wheezing or shortness of breath.  . budesonide-formoterol (SYMBICORT) 80-4.5 MCG/ACT inhaler Inhale 2 puffs into the lungs 2 (two) times daily.  . clonazePAM (KLONOPIN) 0.5 MG tablet Take 1 tablet (0.5 mg total) by mouth 2 (two) times daily as needed for anxiety.  . Cyanocobalamin 1000 MCG SUBL Place 1 tablet (1,000 mcg total) under the tongue daily. (Patient not taking: Reported on 01/29/2017)  . diphenoxylate-atropine (LOMOTIL) 2.5-0.025 MG tablet Take 1 tablet by mouth daily. Reported on 12/05/2015 (Patient not taking: Reported on 01/29/2017)  . loratadine (CLARITIN) 10 MG tablet Take 10 mg by mouth daily.  Marland Kitchen LORazepam (ATIVAN) 0.5 MG tablet Take 1 tablet (0.5 mg total) by mouth every 8 (eight) hours.  . montelukast (SINGULAIR) 10 MG tablet Take 1 tablet (10 mg total) by mouth  at bedtime.  . [DISCONTINUED] cyanocobalamin (,VITAMIN B-12,) 1000 MCG/ML injection Inject 1 mL (1,000 mcg total) into the muscle once. (Patient not taking: Reported on 01/29/2017)  . [DISCONTINUED] Multiple Vitamin (MULTIVITAMIN) capsule Take 1 capsule by mouth daily.    Past Medical History:  Diagnosis Date  . Anxiety   . Contraception   . Headache   . Vaginal Pap smear, abnormal     No past surgical history on file.  Social History Social History  Substance Use Topics  . Smoking status: Never Smoker  . Smokeless tobacco: Never Used  . Alcohol use 3.5 oz/week    7 Standard drinks or equivalent per week    Family History Family History  Problem Relation Age of Onset  . Diabetes Mother   . Cancer Mother        skin ca  . Diabetes Father   . Diabetes Maternal Grandmother   . Diabetes Maternal Grandfather   . Diabetes Paternal Grandmother   . Diabetes Paternal Grandfather     No Known Allergies   REVIEW OF SYSTEMS (Negative unless checked)  Constitutional: [] Weight loss  [] Fever  [] Chills Cardiac: [] Chest pain   [] Chest pressure   [] Palpitations   [] Shortness of breath when laying flat   [] Shortness of breath with exertion. Vascular:  [] Pain in legs with walking   [] Pain in legs at rest  [] History of DVT   [] Phlebitis   [x] Swelling in legs   [x] Varicose veins   [] Non-healing ulcers Pulmonary:   [] Uses home oxygen   [] Productive cough   [] Hemoptysis   []   Wheeze  [] COPD   [] Asthma Neurologic:  [] Dizziness   [] Seizures   [] History of stroke   [] History of TIA  [] Aphasia   [] Vissual changes   [] Weakness or numbness in arm   [] Weakness or numbness in leg Musculoskeletal:   [] Joint swelling   [] Joint pain   [] Low back pain Hematologic:  [] Easy bruising  [] Easy bleeding   [] Hypercoagulable state   [] Anemic Gastrointestinal:  [] Diarrhea   [] Vomiting  [] Gastroesophageal reflux/heartburn   [] Difficulty swallowing. Genitourinary:  [] Chronic kidney disease   [] Difficult urination   [] Frequent urination   [] Blood in urine Skin:  [] Rashes   [] Ulcers  Psychological:  [] History of anxiety   []  History of major depression.  Physical Examination  Vitals:   01/28/17 1457  BP: 108/70  Pulse: (!) 55  Resp: 16  Weight: 177 lb 12.8 oz (80.6 kg)   Body mass index is 29.59 kg/m. Gen: WD/WN, NAD Head: [place]/AT, No temporalis wasting.  Ear/Nose/Throat: Hearing grossly intact, nares w/o erythema or drainage Eyes: PER, EOMI, sclera nonicteric.  Neck: Supple, no large masses.   Pulmonary:  Good air movement, no audible wheezing bilaterally, no use of accessory muscles.  Cardiac: RRR, no JVD Vascular: Large varicosities present diffusely bilaterally.  Mild venous stasis changes to the legs bilaterally.  trace soft pitting edema Vessel Right Left  Radial Palpable Palpable  PT Palpable Palpable  DP Palpable Palpable  Gastrointestinal: Non-distended. No guarding/no peritoneal signs.  Musculoskeletal: M/S 5/5 throughout.  No deformity or atrophy.  Neurologic: CN 2-12 intact. Symmetrical.  Speech is fluent. Motor exam as listed above. Psychiatric: Judgment intact, Mood & affect appropriate for pt's clinical situation. Dermatologic: No rashes or ulcers noted.  No changes consistent with cellulitis. Lymph : No lichenification or skin changes of chronic lymphedema.  CBC Lab Results  Component Value Date   WBC 6.6 01/29/2017   HGB 12.0 01/29/2017   HCT 35.9 01/29/2017   MCV 82 01/29/2017   PLT 295 01/29/2017    BMET    Component Value Date/Time   NA 139 01/29/2017 1029   K 3.9 01/29/2017 1029   CL 102 01/29/2017 1029   CO2 22 01/29/2017 1029   GLUCOSE 91 01/29/2017 1029   BUN 16 01/29/2017 1029   CREATININE 0.83 01/29/2017 1029   CALCIUM 9.2 01/29/2017 1029   GFRNONAA 93 01/29/2017 1029   GFRAA 107 01/29/2017 1029   Estimated Creatinine Clearance: 101.2 mL/min (by C-G formula based on SCr of 0.83 mg/dL).  COAG No results found for: INR, PROTIME  Radiology No  results found.  Assessment/Plan 1. Varicose veins of bilateral lower extremities with pain Recommend:  The patient is complaining of varicose veins.    I have had a long discussion with the patient regarding  varicose veins and why they cause symptoms.  Patient will begin wearing graduated compression stockings on a daily basis, beginning first thing in the morning and removing them in the evening. The patient is instructed specifically not to sleep in the stockings.    The patient  will also begin using over-the-counter analgesics such as Motrin 600 mg po TID to help control the symptoms as needed.    In addition, behavioral modification including elevation during the day will be initiated, utilizing a recliner was recommended.  The patient is also instructed to continue exercising such as walking 4-5 times per week.  At this time the patient wishes to continue conservative therapy and is not interested in more invasive treatments such as laser ablation and  sclerotherapy.  The Patient will follow up PRN if the symptoms worsen.    2. Chronic venous insufficiency No surgery or intervention at this point in time.    I have had a long discussion with the patient regarding venous insufficiency and why it  causes symptoms. I have discussed with the patient the chronic skin changes that accompany venous insufficiency and the long term sequela such as infection and ulceration.  Patient will begin wearing graduated compression stockings class 1 (20-30 mmHg) or compression wraps on a daily basis a prescription was given. The patient will put the stockings on first thing in the morning and removing them in the evening. The patient is instructed specifically not to sleep in the stockings.    In addition, behavioral modification including several periods of elevation of the lower extremities during the day will be continued. I have demonstrated that proper elevation is a position with the ankles at heart  level.  The patient is instructed to begin routine exercise, especially walking on a daily basis  Following the review of the ultrasound the patient will follow up PRN to reassess the degree of swelling and the control that graduated compression stockings or compression wraps  is offering.     Hortencia Pilar, MD  01/31/2017 8:08 PM

## 2017-02-26 ENCOUNTER — Ambulatory Visit (INDEPENDENT_AMBULATORY_CARE_PROVIDER_SITE_OTHER): Payer: 59 | Admitting: Obstetrics and Gynecology

## 2017-02-26 VITALS — BP 96/70 | HR 83 | Wt 177.0 lb

## 2017-02-26 DIAGNOSIS — E663 Overweight: Secondary | ICD-10-CM | POA: Diagnosis not present

## 2017-02-26 MED ORDER — CYANOCOBALAMIN 1000 MCG/ML IJ SOLN
1000.0000 ug | Freq: Once | INTRAMUSCULAR | Status: AC
  Administered 2017-02-26: 1000 ug via INTRAMUSCULAR

## 2017-02-26 NOTE — Progress Notes (Signed)
Pt is here for wt, bp check, b- 12 inj, she isnt taking her phentermine, only likes the b-12   02/26/17 wt- 176.7lb 01/29/17 wt- 177lb

## 2017-03-30 ENCOUNTER — Other Ambulatory Visit: Payer: Self-pay | Admitting: Obstetrics and Gynecology

## 2017-03-30 ENCOUNTER — Other Ambulatory Visit: Payer: Self-pay | Admitting: Internal Medicine

## 2017-05-13 ENCOUNTER — Ambulatory Visit (INDEPENDENT_AMBULATORY_CARE_PROVIDER_SITE_OTHER): Payer: 59 | Admitting: Obstetrics and Gynecology

## 2017-05-13 VITALS — BP 124/84 | HR 64 | Ht 65.0 in | Wt 182.9 lb

## 2017-05-13 DIAGNOSIS — Z01419 Encounter for gynecological examination (general) (routine) without abnormal findings: Secondary | ICD-10-CM

## 2017-05-13 DIAGNOSIS — F419 Anxiety disorder, unspecified: Secondary | ICD-10-CM

## 2017-05-13 MED ORDER — LORAZEPAM 0.5 MG PO TABS: 0.5000 mg | ORAL_TABLET | Freq: Three times a day (TID) | ORAL | 2 refills | Status: DC

## 2017-05-13 MED ORDER — FLUOXETINE HCL 10 MG PO CAPS: 10.0000 mg | ORAL_CAPSULE | Freq: Every day | ORAL | 3 refills | Status: DC

## 2017-05-13 NOTE — Progress Notes (Signed)
Subjective:   Kendra Ramos is a 31 y.o. G39P0 Caucasian female here for a routine well-woman exam.  Patient's last menstrual period was 04/29/2017.    Current complaints: worsening anxiety having to take three ativan to sleep. 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  Past Medical History Past Medical History:  Diagnosis Date  . Anxiety   . Contraception   . Headache   . Vaginal Pap smear, abnormal     Past Surgical History No past surgical history on file.  Gynecologic History G1P0  Patient's last menstrual period was 04/29/2017. Contraception: NuvaRing vaginal inserts Last Pap: 2016. Results were: normal   Obstetric History OB History  Gravida Para Term Preterm AB Living  1         1  SAB TAB Ectopic Multiple Live Births          1    # Outcome Date GA Lbr Len/2nd Weight Sex Delivery Anes PTL Lv  1 Gravida 2015    F Vag-Spont   LIV      Current Medications Current Outpatient Prescriptions on File Prior to Visit  Medication Sig Dispense Refill  . albuterol (PROAIR HFA) 108 (90 Base) MCG/ACT inhaler Inhale 2 puffs into the lungs every 6 (six) hours as needed for wheezing or shortness of breath. 6.7 g 2  . budesonide-formoterol (SYMBICORT) 80-4.5 MCG/ACT inhaler Inhale 2 puffs into the lungs 2 (two) times daily. 1 Inhaler 1  . cyanocobalamin (,VITAMIN B-12,) 1000 MCG/ML injection INJECT INTRAMUSCULARLY 1ML  EVERY MONTH 3 mL 1  . loratadine (CLARITIN) 10 MG tablet Take 10 mg by mouth daily.    Marland Kitchen LORazepam (ATIVAN) 0.5 MG tablet Take 1 tablet (0.5  mg total) by mouth every 8 (eight) hours. 30 tablet 2  . clonazePAM (KLONOPIN) 0.5 MG tablet Take 1 tablet (0.5 mg total) by mouth 2 (two) times daily as needed for anxiety. (Patient not taking: Reported on 05/13/2017) 90 tablet 1  . Cyanocobalamin 1000 MCG SUBL Place 1 tablet (1,000 mcg total) under the tongue daily. (Patient not taking: Reported on 01/29/2017) 90 tablet 3  . diphenoxylate-atropine (LOMOTIL) 2.5-0.025 MG tablet Take 1 tablet by mouth daily. Reported on 12/05/2015 (Patient not taking: Reported on 01/29/2017) 90 tablet 6  . etonogestrel-ethinyl estradiol (NUVARING) 0.12-0.015 MG/24HR vaginal ring Insert vaginally and leave in place for 3 consecutive weeks, then remove for 1 week. 3 each 4  . montelukast (SINGULAIR) 10 MG tablet TAKE 1 TABLET BY MOUTH AT  BEDTIME (Patient not taking: Reported on 05/13/2017) 90 tablet 0  . phentermine (ADIPEX-P) 37.5 MG tablet Take 1 tablet (37.5 mg total) by mouth daily before breakfast. (Patient not taking: Reported on 05/13/2017) 30 tablet 2   No current facility-administered medications on file prior to visit.     Review of Systems Patient denies any headaches, blurred vision, shortness of breath, chest pain, abdominal pain, problems with bowel movements, urination, or intercourse.  Objective:  BP 124/84   Pulse 64   Ht 5\' 5"  (1.651 m)   Wt 182 lb 14.4 oz (83 kg)   LMP 04/29/2017   BMI 30.44 kg/m  Physical Exam  General:  Well developed, well nourished, no acute distress. She is alert and oriented x3. Skin:  Warm and dry Neck:  Midline trachea, no thyromegaly or nodules Cardiovascular: Regular rate and rhythm, no murmur heard Lungs:  Effort normal, all lung fields clear to auscultation bilaterally Breasts:  No dominant palpable mass, retraction, or nipple discharge Abdomen:  Soft, non tender, no hepatosplenomegaly or masses Pelvic:  External genitalia is normal in appearance.  The vagina is normal in appearance. The cervix is bulbous, no  CMT.  Thin prep pap is not done . Uterus is felt to be normal size, shape, and contour.  No adnexal masses or tenderness noted.  Extremities:  No swelling or varicosities noted Psych:  She has a normal mood and affect  Assessment:   Healthy well-woman exam  Plan:  Discussed SSRI use for anxiety treatment- open to trying it. F/U 1 year for AE, or sooner if needed  Melody Rockney Ghee, CNM

## 2017-07-05 ENCOUNTER — Other Ambulatory Visit: Payer: Self-pay | Admitting: *Deleted

## 2017-07-05 MED ORDER — TERCONAZOLE 0.4 % VA CREA: 1.0000 | TOPICAL_CREAM | Freq: Every day | VAGINAL | 0 refills | Status: DC

## 2017-07-05 MED ORDER — FLUCONAZOLE 150 MG PO TABS: 150.0000 mg | ORAL_TABLET | Freq: Once | ORAL | 0 refills | Status: AC

## 2017-08-17 ENCOUNTER — Other Ambulatory Visit: Payer: Self-pay | Admitting: *Deleted

## 2017-08-17 MED ORDER — FLUCONAZOLE 150 MG PO TABS: 150.0000 mg | ORAL_TABLET | Freq: Once | ORAL | 2 refills | Status: AC

## 2017-11-08 ENCOUNTER — Other Ambulatory Visit: Payer: Self-pay | Admitting: Internal Medicine

## 2017-11-08 MED ORDER — MONTELUKAST SODIUM 10 MG PO TABS: 10.0000 mg | ORAL_TABLET | Freq: Every day | ORAL | 1 refills | Status: DC

## 2017-11-11 ENCOUNTER — Other Ambulatory Visit: Payer: Self-pay | Admitting: Internal Medicine

## 2018-03-22 ENCOUNTER — Encounter: Payer: Self-pay | Admitting: Obstetrics and Gynecology

## 2018-03-22 ENCOUNTER — Ambulatory Visit (INDEPENDENT_AMBULATORY_CARE_PROVIDER_SITE_OTHER): Payer: Managed Care, Other (non HMO) | Admitting: Obstetrics and Gynecology

## 2018-03-22 VITALS — BP 99/74 | HR 70 | Ht 65.0 in | Wt 194.9 lb

## 2018-03-22 DIAGNOSIS — F419 Anxiety disorder, unspecified: Secondary | ICD-10-CM | POA: Diagnosis not present

## 2018-03-22 DIAGNOSIS — E669 Obesity, unspecified: Secondary | ICD-10-CM | POA: Diagnosis not present

## 2018-03-22 DIAGNOSIS — Z6832 Body mass index (BMI) 32.0-32.9, adult: Secondary | ICD-10-CM | POA: Diagnosis not present

## 2018-03-22 MED ORDER — CYANOCOBALAMIN 1000 MCG/ML IJ SOLN: INTRAMUSCULAR | 1 refills | Status: DC

## 2018-03-22 NOTE — Patient Instructions (Signed)

## 2018-03-22 NOTE — Progress Notes (Signed)
Here to discuss, increased stress and anxiety. States they are building a house, spouse is gone a lot with worked and she is emotionally eating again. She didn't think to take ativan or xanax. But a few glasses of wine helps.  Also is interested in restarting the weight loss medication. It has worked well in the past. Is exercising sporadically. Has put 18 #s back on.   Assessment  A&Ox4 Well groomed female in no distress Blood pressure 99/74, pulse 70, height 5\' 5"  (1.651 m), weight 194 lb 14.4 oz (88.4 kg), last menstrual period 03/04/2018. Body mass index is 32.43 kg/m.  Psychiatric Specialty Exam: Physical Exam  ROS  Blood pressure 99/74, pulse 70, height 5\' 5"  (1.651 m), weight 194 lb 14.4 oz (88.4 kg), last menstrual period 03/04/2018.Body mass index is 32.43 kg/m.  General Appearance: Neat and Well Groomed  Eye Contact:  Good  Speech:  Pressured  Volume:  Normal  Mood:  Anxious  Affect:  Congruent and Full Range  Thought Process:  Coherent and Goal Directed  Orientation:  Full (Time, Place, and Person)  Thought Content:  WDL and Logical  Suicidal Thoughts:  No  Homicidal Thoughts:  No  Memory:  Immediate;   Good Recent;   Good Remote;   Good  Judgement:  Fair  Insight:  Fair  Psychomotor Activity:  Normal  Concentration:  Concentration: Good and Attention Span: Good  Recall:  Good  Fund of Knowledge:  Good  Language:  Good  Akathisia:  Negative  Handed:  Right  AIMS (if indicated):     Assets:  Communication Skills Desire for Improvement Financial Resources/Insurance Housing Intimacy Physical Health Resilience Social Support Transportation Vocational/Educational  ADL's:  Intact  Cognition:  WNL  Sleep:   no change    Impression: Anxiety Obesity  Plan: to restart prn xanax or lorazepam use. Restarted weight loss program and B12 injection given. Encouraged increasing exercise, and writing list to get things done as it de-stresses her, RTC 4 weeks for  wt/BP/B12 with nurse and prn.   Melody Shambley,CNM

## 2018-03-23 ENCOUNTER — Telehealth: Payer: Self-pay | Admitting: Obstetrics and Gynecology

## 2018-03-23 NOTE — Telephone Encounter (Signed)
Done-ac 

## 2018-03-23 NOTE — Telephone Encounter (Signed)
Patient asked if Amy could fax her paperwork to this number:  [phone]  Thanks

## 2018-04-19 ENCOUNTER — Encounter: Payer: Self-pay | Admitting: Obstetrics and Gynecology

## 2018-04-19 ENCOUNTER — Encounter: Payer: Self-pay | Admitting: *Deleted

## 2018-04-19 ENCOUNTER — Ambulatory Visit (INDEPENDENT_AMBULATORY_CARE_PROVIDER_SITE_OTHER): Payer: Managed Care, Other (non HMO) | Admitting: Obstetrics and Gynecology

## 2018-04-19 VITALS — BP 126/74 | HR 69 | Ht 65.0 in | Wt 189.9 lb

## 2018-04-19 DIAGNOSIS — E669 Obesity, unspecified: Secondary | ICD-10-CM

## 2018-04-19 MED ORDER — CYANOCOBALAMIN 1000 MCG/ML IJ SOLN
1000.0000 ug | Freq: Once | INTRAMUSCULAR | Status: AC
  Administered 2018-04-19: 1000 ug via INTRAMUSCULAR

## 2018-04-19 NOTE — Progress Notes (Signed)
Pt is here for wt, bp check, b-12 inj She is doing well  04/19/18 wt- 189.9lb 03/22/18 wt- 194lb  Waist:30 in

## 2018-04-29 ENCOUNTER — Encounter

## 2018-04-29 ENCOUNTER — Ambulatory Visit: Payer: 59 | Admitting: Internal Medicine

## 2018-04-29 ENCOUNTER — Encounter: Payer: Self-pay | Admitting: Internal Medicine

## 2018-04-29 DIAGNOSIS — M21622 Bunionette of left foot: Secondary | ICD-10-CM | POA: Diagnosis not present

## 2018-04-29 DIAGNOSIS — E669 Obesity, unspecified: Secondary | ICD-10-CM | POA: Diagnosis not present

## 2018-04-29 DIAGNOSIS — F411 Generalized anxiety disorder: Secondary | ICD-10-CM | POA: Diagnosis not present

## 2018-04-29 MED ORDER — TRAZODONE HCL 50 MG PO TABS: 25.0000 mg | ORAL_TABLET | Freq: Every evening | ORAL | 3 refills | Status: DC | PRN

## 2018-04-29 NOTE — Progress Notes (Signed)
Subjective:  Patient ID: Kendra Ramos, female    DOB: 1986/02/12  Age: 32 y.o. MRN: [id number]  CC: Diagnoses of Bunionette of left foot, Obesity (BMI 30.0-34.9), and Generalized anxiety disorder were pertinent to this visit.  HPI Kendra Ramos presents for follow up on recent episode of pain involving her left foot . Last seen Oct 2017  She states that several weeks ago she developed stabbing pain between the 1st and 2nd metatarsal  heads on the plantar surface of her left foot that would radiate to midfoot.  Brought on  by   walking more than 5 minutes and inhibited her from exercising.  Was not present at rest.  Tried to get an appointment here but was given a 6 week wait time so she saw chiropractor,  Who idenitified a mild bunion,  fallen arches,  And use of toe spacer and arch support .  Her pain has resolved since she added these supportive devices and she has been able to resume exercise.  Obesity:  She has been taking phentermine,  prescribed by her GYN NP .  She is not taking it daily, using it every 2 or 3 days, and has lost 5 lbs in the last month. Denies any sie effects from the medication   GAD:  She admits to having increased anxiety related to  The demands of her position at [employer] but has ot had panic attacks in over a year. .  She did not tolerate the side effects of Prozac , and used clonazepam intermittently but has not used it in several weeks  She is contemplative of alternative SSRI therapy (discussed Celexa today)    HM: sh eis up to date on cervical CA screening.  Using NuvaRing for contraception .   Outpatient Medications Prior to Visit  Medication Sig Dispense Refill  . albuterol (PROAIR HFA) 108 (90 Base) MCG/ACT inhaler Inhale 2 puffs into the lungs every 6 (six) hours as needed for wheezing or shortness of breath. 6.7 g 2  . budesonide-formoterol (SYMBICORT) 80-4.5 MCG/ACT inhaler Inhale 2 puffs into the lungs 2 (two) times daily. 1 Inhaler 1  .  cyanocobalamin (,VITAMIN B-12,) 1000 MCG/ML injection INJECT INTRAMUSCULARLY 1ML  EVERY MONTH 3 mL 1  . loratadine (CLARITIN) 10 MG tablet Take 10 mg by mouth daily.    Marland Kitchen LORazepam (ATIVAN) 0.5 MG tablet Take 1 tablet (0.5 mg total) by mouth every 8 (eight) hours. 60 tablet 2  . montelukast (SINGULAIR) 10 MG tablet TAKE 1 TABLET BY MOUTH AT  BEDTIME 90 tablet 1  . phentermine (ADIPEX-P) 37.5 MG tablet Take 1 tablet (37.5 mg total) by mouth daily before breakfast. (Patient taking differently: Take 37.5 mg by mouth daily before breakfast. ) 30 tablet 2  . etonogestrel-ethinyl estradiol (NUVARING) 0.12-0.015 MG/24HR vaginal ring Insert vaginally and leave in place for 3 consecutive weeks, then remove for 1 week. 3 each 4  . clonazePAM (KLONOPIN) 0.5 MG tablet Take 1 tablet (0.5 mg total) by mouth 2 (two) times daily as needed for anxiety. (Patient not taking: Reported on 05/13/2017) 90 tablet 1  . diphenoxylate-atropine (LOMOTIL) 2.5-0.025 MG tablet Take 1 tablet by mouth daily. Reported on 12/05/2015 (Patient not taking: Reported on 01/29/2017) 90 tablet 6  . FLUoxetine (PROZAC) 10 MG capsule Take 1 capsule (10 mg total) by mouth daily. (Patient not taking: Reported on 04/29/2018) 30 capsule 3  . terconazole (TERAZOL 7) 0.4 % vaginal cream Place 1 applicator vaginally at bedtime. (Patient not taking:  Reported on 04/29/2018) 45 g 0   No facility-administered medications prior to visit.     Review of Systems;  Patient denies headache, fevers, malaise, unintentional weight loss, skin rash, eye pain, sinus congestion and sinus pain, sore throat, dysphagia,  hemoptysis , cough, dyspnea, wheezing, chest pain, palpitations, orthopnea, edema, abdominal pain, nausea, melena, diarrhea, constipation, flank pain, dysuria, hematuria, urinary  Frequency, nocturia, numbness, tingling, seizures,  Focal weakness, Loss of consciousness,  Tremor, insomnia, depression, anxiety, and suicidal ideation.      Objective:  BP  106/72 (BP Location: Left Arm, Patient Position: Sitting, Cuff Size: Large)   Pulse 76   Temp 98.2 F (36.8 C) (Oral)   Resp 14   Ht 5\' 5"  (1.651 m)   Wt 190 lb 12.8 oz (86.5 kg)   SpO2 98%   BMI 31.75 kg/m   BP Readings from Last 3 Encounters:  04/29/18 106/72  04/19/18 126/74  03/22/18 99/74    Wt Readings from Last 3 Encounters:  04/29/18 190 lb 12.8 oz (86.5 kg)  04/19/18 189 lb 14.4 oz (86.1 kg)  03/22/18 194 lb 14.4 oz (88.4 kg)    General appearance: alert, cooperative and appears stated age Ears: normal TM's and external ear canals both ears Throat: lips, mucosa, and tongue normal; teeth and gums normal Neck: no adenopathy, no carotid bruit, supple, symmetrical, trachea midline and thyroid not enlarged, symmetric, no tenderness/mass/nodules Back: symmetric, no curvature. ROM normal. No CVA tenderness. Lungs: clear to auscultation bilaterally Heart: regular rate and rhythm, S1, S2 normal, no murmur, click, rub or gallop Abdomen: soft, non-tender; bowel sounds normal; no masses,  no organomegaly Pulses: 2+ and symmetric Skin: Skin color, texture, turgor normal. No rashes or lesions MSK: left foot with small bunion causing slight lateral deviation of great toe.  Lymph nodes: Cervical, supraclavicular, and axillary nodes normal.  Lab Results  Component Value Date   HGBA1C 5.1 03/25/2016   HGBA1C 5.5 03/22/2015   HGBA1C 5.2 07/04/2013    Lab Results  Component Value Date   CREATININE 0.83 01/29/2017   CREATININE 0.91 03/25/2016   CREATININE 0.71 03/22/2015    Lab Results  Component Value Date   WBC 6.6 01/29/2017   HGB 12.0 01/29/2017   HCT 35.9 01/29/2017   PLT 295 01/29/2017   GLUCOSE 91 01/29/2017   CHOL 167 03/22/2015   TRIG 127 03/22/2015   HDL 66 03/22/2015   LDLCALC 76 03/22/2015   ALT 14 01/29/2017   AST 20 01/29/2017   NA 139 01/29/2017   K 3.9 01/29/2017   CL 102 01/29/2017   CREATININE 0.83 01/29/2017   BUN 16 01/29/2017   CO2 22  01/29/2017   TSH 2.000 01/29/2017   HGBA1C 5.1 03/25/2016    No results found.  Assessment & Plan:   Problem List Items Addressed This Visit    Bunionette of left foot    Advised to continue use of spacer,  Arch support and shoes with wide toe box no pidatiry referral needed at this point in time as her pain has resolved.       Generalized anxiety disorder    Improved compared to 2017,  No longer having panic attacks  Contemplative of starting another SSRI trial since seh did ont tolerate Prozac.  Discusses trial of celexa since it is will tolerated       Relevant Medications   traZODone (DESYREL) 50 MG tablet   Obesity (BMI 30.0-34.9)    She is exercising regularly and using phentermine  intermittently to control appetite.          I have discontinued Sylver Vantassell. Gambill's clonazePAM, diphenoxylate-atropine, FLUoxetine, and terconazole. I am also having her start on traZODone. Additionally, I am having her maintain her loratadine, etonogestrel-ethinyl estradiol, budesonide-formoterol, albuterol, phentermine, LORazepam, montelukast, and cyanocobalamin.  Meds ordered this encounter  Medications  . traZODone (DESYREL) 50 MG tablet    Sig: Take 0.5-1 tablets (25-50 mg total) by mouth at bedtime as needed for sleep.    Dispense:  30 tablet    Refill:  3    Medications Discontinued During This Encounter  Medication Reason  . terconazole (TERAZOL 7) 0.4 % vaginal cream Completed Course  . clonazePAM (KLONOPIN) 0.5 MG tablet   . FLUoxetine (PROZAC) 10 MG capsule   . diphenoxylate-atropine (LOMOTIL) 2.5-0.025 MG tablet     Follow-up: No follow-ups on file.   Crecencio Mc, MD

## 2018-04-29 NOTE — Patient Instructions (Addendum)
Good to see you!   No podiatry referral needed unless foot pain returns   I recommend a trial of trazodone  for your insomnia  Start with 1/2 tablet 1 hour before bedimte  May increase to full tablet  Lorazepam long term use has been linked to dementia  If anxiety needs treatment with a daily SSRI,  Consider citalopram (celexa)

## 2018-05-01 DIAGNOSIS — E669 Obesity, unspecified: Secondary | ICD-10-CM | POA: Insufficient documentation

## 2018-05-01 DIAGNOSIS — M21622 Bunionette of left foot: Secondary | ICD-10-CM | POA: Insufficient documentation

## 2018-05-01 NOTE — Assessment & Plan Note (Signed)
She is exercising regularly and using phentermine intermittently to control appetite.

## 2018-05-01 NOTE — Assessment & Plan Note (Signed)
Improved compared to 2017,  No longer having panic attacks  Contemplative of starting another SSRI trial since seh did ont tolerate Prozac.  Discusses trial of celexa since it is will tolerated

## 2018-05-01 NOTE — Assessment & Plan Note (Signed)
Advised to continue use of spacer,  Arch support and shoes with wide toe box no pidatiry referral needed at this point in time as her pain has resolved.

## 2018-05-18 ENCOUNTER — Ambulatory Visit (INDEPENDENT_AMBULATORY_CARE_PROVIDER_SITE_OTHER): Payer: Managed Care, Other (non HMO) | Admitting: Obstetrics and Gynecology

## 2018-05-18 ENCOUNTER — Encounter: Payer: Self-pay | Admitting: Obstetrics and Gynecology

## 2018-05-18 VITALS — BP 107/76 | HR 72 | Ht 65.0 in | Wt 191.6 lb

## 2018-05-18 DIAGNOSIS — F419 Anxiety disorder, unspecified: Secondary | ICD-10-CM

## 2018-05-18 DIAGNOSIS — Z01411 Encounter for gynecological examination (general) (routine) with abnormal findings: Secondary | ICD-10-CM

## 2018-05-18 DIAGNOSIS — Z01419 Encounter for gynecological examination (general) (routine) without abnormal findings: Secondary | ICD-10-CM

## 2018-05-18 DIAGNOSIS — E669 Obesity, unspecified: Secondary | ICD-10-CM

## 2018-05-18 DIAGNOSIS — E663 Overweight: Secondary | ICD-10-CM

## 2018-05-18 DIAGNOSIS — Z23 Encounter for immunization: Secondary | ICD-10-CM | POA: Diagnosis not present

## 2018-05-18 NOTE — Progress Notes (Signed)
Subjective:   AQUILA MENZIE is a 32 y.o. G57P0 Caucasian female here for a routine well-woman exam.  Patient's last menstrual period was 05/18/2018.    Current complaints: none PCP: [facility]       does desire labs & flu vaccine  Social History: Sexual: heterosexual Marital Status: married Living situation: with family Occupation: unknown occupation Tobacco/alcohol: no tobacco use Illicit drugs: no history of illicit drug use  The following portions of the patient's history were reviewed and updated as appropriate: allergies, current medications, past family history, past medical history, past social history, past surgical history and problem list.  Past Medical History Past Medical History:  Diagnosis Date  . Anxiety   . Contraception   . Headache   . Vaginal Pap smear, abnormal     Past Surgical History History reviewed. No pertinent surgical history.  Gynecologic History G1P0  Patient's last menstrual period was 05/18/2018. Contraception: NuvaRing vaginal inserts Last Pap: 2016. Results were: normal  Obstetric History OB History  Gravida Para Term Preterm AB Living  1         1  SAB TAB Ectopic Multiple Live Births          1    # Outcome Date GA Lbr Len/2nd Weight Sex Delivery Anes PTL Lv  1 Gravida 2015    F Vag-Spont   LIV    Current Medications Current Outpatient Medications on File Prior to Visit  Medication Sig Dispense Refill  . albuterol (PROAIR HFA) 108 (90 Base) MCG/ACT inhaler Inhale 2 puffs into the lungs every 6 (six) hours as needed for wheezing or shortness of breath. 6.7 g 2  . budesonide-formoterol (SYMBICORT) 80-4.5 MCG/ACT inhaler Inhale 2 puffs into the lungs 2 (two) times daily. 1 Inhaler 1  . cyanocobalamin (,VITAMIN B-12,) 1000 MCG/ML injection INJECT INTRAMUSCULARLY 1ML  EVERY MONTH 3 mL 1  . etonogestrel-ethinyl estradiol (NUVARING) 0.12-0.015 MG/24HR vaginal ring Place 1 each vaginally every 28 (twenty-eight) days. Insert vaginally and  leave in place for 3 consecutive weeks, then remove for 1 week.    . loratadine (CLARITIN) 10 MG tablet Take 10 mg by mouth daily.    Marland Kitchen LORazepam (ATIVAN) 0.5 MG tablet Take 1 tablet (0.5 mg total) by mouth every 8 (eight) hours. 60 tablet 2  . montelukast (SINGULAIR) 10 MG tablet TAKE 1 TABLET BY MOUTH AT  BEDTIME 90 tablet 1  . phentermine (ADIPEX-P) 37.5 MG tablet Take 1 tablet (37.5 mg total) by mouth daily before breakfast. (Patient taking differently: Take 37.5 mg by mouth daily before breakfast. ) 30 tablet 2  . traZODone (DESYREL) 50 MG tablet Take 0.5-1 tablets (25-50 mg total) by mouth at bedtime as needed for sleep. 30 tablet 3  . etonogestrel-ethinyl estradiol (NUVARING) 0.12-0.015 MG/24HR vaginal ring Insert vaginally and leave in place for 3 consecutive weeks, then remove for 1 week. 3 each 4   No current facility-administered medications on file prior to visit.     Review of Systems Patient denies any headaches, blurred vision, shortness of breath, chest pain, abdominal pain, problems with bowel movements, urination, or intercourse.  Objective:  BP 107/76   Pulse 72   Ht 5\' 5"  (1.651 m)   Wt 191 lb 9.6 oz (86.9 kg)   LMP 05/18/2018   BMI 31.88 kg/m  Physical Exam  General:  Well developed, well nourished, no acute distress. She is alert and oriented x3. Skin:  Warm and dry Neck:  Midline trachea, no thyromegaly or nodules Cardiovascular: Regular  rate and rhythm, no murmur heard Lungs:  Effort normal, all lung fields clear to auscultation bilaterally Breasts:  No dominant palpable mass, retraction, or nipple discharge Abdomen:  Soft, non tender, no hepatosplenomegaly or masses Pelvic:  External genitalia is normal in appearance.  The vagina is normal in appearance. The cervix is bulbous, no CMT.  Thin prep pap is done with HR HPV cotesting. Uterus is felt to be normal size, shape, and contour.  No adnexal masses or tenderness noted. Extremities:  No swelling or  varicosities noted Psych:  She has a normal mood and affect  Assessment:   Healthy well-woman exam Anxiety Sleep disturbances Overweight Needs flu vaccine  Plan:  Labs obtained will follow up accordingly F/U 1 year for AE, or sooner if needed Flu vaccine given along with planned B12 shot.   Jarita Raval [place], CNM

## 2018-05-19 LAB — COMPREHENSIVE METABOLIC PANEL
A/G RATIO: 2 (ref 1.2–2.2)
ALBUMIN: 4.3 g/dL (ref 3.5–5.5)
ALT: 27 IU/L (ref 0–32)
AST: 24 IU/L (ref 0–40)
Alkaline Phosphatase: 38 IU/L — ABNORMAL LOW (ref 39–117)
BUN/Creatinine Ratio: 11 (ref 9–23)
BUN: 10 mg/dL (ref 6–20)
Bilirubin Total: <0.2 mg/dL (ref 0.0–1.2)
CALCIUM: 9.4 mg/dL (ref 8.7–10.2)
CO2: 23 mmol/L (ref 20–29)
CREATININE: 0.87 mg/dL (ref 0.57–1.00)
Chloride: 102 mmol/L (ref 96–106)
GFR, EST AFRICAN AMERICAN: 101 mL/min/{1.73_m2} (ref >59)
GFR, EST NON AFRICAN AMERICAN: 87 mL/min/{1.73_m2} (ref >59)
GLOBULIN, TOTAL: 2.2 g/dL (ref 1.5–4.5)
Glucose: 115 mg/dL — ABNORMAL HIGH (ref 65–99)
POTASSIUM: 3.7 mmol/L (ref 3.5–5.2)
SODIUM: 140 mmol/L (ref 134–144)
TOTAL PROTEIN: 6.5 g/dL (ref 6.0–8.5)

## 2018-05-19 LAB — LIPID PANEL
CHOLESTEROL TOTAL: 167 mg/dL (ref 100–199)
Chol/HDL Ratio: 2 ratio (ref 0.0–4.4)
HDL: 85 mg/dL (ref >39)
LDL Calculated: 63 mg/dL (ref 0–99)
TRIGLYCERIDES: 97 mg/dL (ref 0–149)
VLDL Cholesterol Cal: 19 mg/dL (ref 5–40)

## 2018-05-19 LAB — TSH: TSH: 1.78 u[IU]/mL (ref 0.450–4.500)

## 2018-05-21 LAB — PAP IG W/ RFLX HPV ASCU: PAP Smear Comment: 0

## 2018-06-27 ENCOUNTER — Other Ambulatory Visit: Payer: Self-pay | Admitting: *Deleted

## 2018-06-27 MED ORDER — FLUCONAZOLE 150 MG PO TABS: 150.0000 mg | ORAL_TABLET | Freq: Once | ORAL | 2 refills | Status: AC

## 2018-08-10 ENCOUNTER — Encounter: Payer: Self-pay | Admitting: *Deleted

## 2018-08-10 ENCOUNTER — Other Ambulatory Visit: Payer: Self-pay

## 2018-08-10 MED ORDER — ETONOGESTREL-ETHINYL ESTRADIOL 0.12-0.015 MG/24HR VA RING: VAGINAL_RING | VAGINAL | 4 refills | Status: DC

## 2018-08-11 ENCOUNTER — Other Ambulatory Visit: Payer: Self-pay | Admitting: Obstetrics and Gynecology

## 2018-08-11 MED ORDER — TERCONAZOLE 0.4 % VA CREA: 1.0000 | TOPICAL_CREAM | Freq: Every day | VAGINAL | 0 refills | Status: DC

## 2018-08-22 ENCOUNTER — Other Ambulatory Visit: Payer: Self-pay | Admitting: Internal Medicine

## 2018-09-06 ENCOUNTER — Other Ambulatory Visit: Payer: Self-pay | Admitting: *Deleted

## 2018-09-06 MED ORDER — ETONOGESTREL-ETHINYL ESTRADIOL 0.12-0.015 MG/24HR VA RING: 1.0000 | VAGINAL_RING | VAGINAL | 6 refills | Status: DC

## 2018-09-25 ENCOUNTER — Other Ambulatory Visit: Payer: Self-pay | Admitting: Internal Medicine

## 2018-10-18 ENCOUNTER — Other Ambulatory Visit: Payer: Self-pay | Admitting: Obstetrics and Gynecology

## 2018-11-24 ENCOUNTER — Other Ambulatory Visit: Payer: Self-pay | Admitting: Internal Medicine

## 2018-11-24 ENCOUNTER — Other Ambulatory Visit: Payer: Self-pay

## 2018-11-24 MED ORDER — TRAZODONE HCL 50 MG PO TABS: ORAL_TABLET | ORAL | 0 refills | Status: DC

## 2018-12-05 ENCOUNTER — Other Ambulatory Visit: Payer: Self-pay | Admitting: Obstetrics and Gynecology

## 2018-12-30 ENCOUNTER — Encounter: Payer: Managed Care, Other (non HMO) | Admitting: Obstetrics and Gynecology

## 2019-01-04 ENCOUNTER — Encounter: Payer: Self-pay | Admitting: Obstetrics and Gynecology

## 2019-01-04 ENCOUNTER — Other Ambulatory Visit: Payer: Self-pay

## 2019-01-04 ENCOUNTER — Ambulatory Visit: Payer: Managed Care, Other (non HMO) | Admitting: Obstetrics and Gynecology

## 2019-01-04 VITALS — BP 101/82 | HR 99 | Ht 66.0 in | Wt 189.4 lb

## 2019-01-04 DIAGNOSIS — E669 Obesity, unspecified: Secondary | ICD-10-CM | POA: Diagnosis not present

## 2019-01-04 DIAGNOSIS — Z6830 Body mass index (BMI) 30.0-30.9, adult: Secondary | ICD-10-CM | POA: Diagnosis not present

## 2019-01-04 DIAGNOSIS — Z7689 Persons encountering health services in other specified circumstances: Secondary | ICD-10-CM

## 2019-01-04 MED ORDER — CYANOCOBALAMIN 1000 MCG/ML IJ SOLN: INTRAMUSCULAR | 1 refills | Status: DC

## 2019-01-04 MED ORDER — PHENTERMINE HCL 37.5 MG PO TABS: 37.5000 mg | ORAL_TABLET | Freq: Every day | ORAL | 2 refills | Status: DC

## 2019-01-04 NOTE — Progress Notes (Signed)
Subjective:  Kendra Ramos is a 33 y.o. G1P0 at Unknown being seen today for weight loss management- initial visit.  Patient reports General ROS: negative and reports previous weight loss attempts:has been successufl with adipex and exercise. Desires restart.     Past treatment has included: small frequent feedings, nutritional supplement, vitamin supplement, , vitamin B-12 injections, appetite suppressant, exercise management .  The following portions of the patient's history were reviewed and updated as appropriate: allergies, current medications, past family history, past medical history, past social history, past surgical history and problem list.   Objective:   Vitals:   01/04/19 1003  BP: 101/82  Pulse: 99  Weight: 189 lb 6.4 oz (85.9 kg)  Height: 5\' 6"  (1.676 m)    General:  Alert, oriented and cooperative. Patient is in no acute distress.  :   :   :   :   :   :   PE: Well groomed female in no current distress,   Mental Status: Normal mood and affect. Normal behavior. Normal judgment and thought content.   Current BMI: Body mass index is 30.57 kg/m.   Assessment and Plan:  Obesity  There are no diagnoses linked to this encounter.  Plan: low carb, High protein diet RX for adipex 37.5 mg daily and B12 1086mcg.ml monthly, to start now with first injection given at today's visit. Reviewed side-effects common to both medications and expected outcomes. Increase daily water intake to at least 8 bottle a day, every day.  Goal is to reduse weight by 10% by end of three months, and will re-evaluate then.  RTC in 4 weeks for Nurse visit to check weight & BP, and get next B12 injections.    Please refer to After Visit Summary for other counseling recommendations.    [place], Bryony Kaman N, CNM   Ignatz Deis [place], CNM      Consider the Low Glycemic Index Diet and 6 smaller meals daily .  This boosts your metabolism and regulates your sugars:   Use the protein  bar by [employer] because they have lots of fiber in them  Find the low carb flatbreads, tortillas and pita breads for sandwiches:  Joseph's makes a pita bread and a flat bread , available at [facility] and [employer]; [place] makes a low carb flatbread available at [employer] and HT that is 9 net carbs and 100 cal [place] makes a low carb whole wheat tortilla available at [employer] most grocery stores with 6 net carbs and 210 cal  [place] yogurt can still have a lot of carbs .  Dannon Light N fit has 80 cal and 8 carbs

## 2019-01-31 ENCOUNTER — Telehealth: Payer: Self-pay | Admitting: *Deleted

## 2019-01-31 NOTE — Telephone Encounter (Signed)
Coronavirus (COVID-19) Are you at risk?  Are you at risk for the Coronavirus (COVID-19)?  To be considered HIGH RISK for Coronavirus (COVID-19), you have to meet the following criteria:  . Traveled to [place], [place], [place], [place] or [place]; or in the [place] to [place], [place], [place], or [place]; and have fever, cough, and shortness of breath within the last 2 weeks of travel OR . Been in close contact with a person diagnosed with COVID-19 within the last 2 weeks and have fever, cough, and shortness of breath . IF YOU DO NOT MEET THESE CRITERIA, YOU ARE CONSIDERED LOW RISK FOR COVID-19.  What to do if you are HIGH RISK for COVID-19?  . If you are having a medical emergency, call 911. . Seek medical care right away. Before you go to a doctor's office, urgent care or emergency department, call ahead and tell them about your recent travel, contact with someone diagnosed with COVID-19, and your symptoms. You should receive instructions from your physician's office regarding next steps of care.  . When you arrive at healthcare provider, tell the healthcare staff immediately you have returned from visiting [place], [place], [place], [place] or [place]; or traveled in the [place] to [place], [place], [place], or [place]; in the last two weeks or you have been in close contact with a person diagnosed with COVID-19 in the last 2 weeks.   . Tell the health care staff about your symptoms: fever, cough and shortness of breath. . After you have been seen by a medical provider, you will be either: o Tested for (COVID-19) and discharged home on quarantine except to seek medical care if symptoms worsen, and asked to  - Stay home and avoid contact with others until you get your results (4-5 days)  - Avoid travel on public transportation if possible (such as bus, train, or airplane) or o Sent to the Emergency Department by EMS for evaluation, COVID-19 testing, and possible  admission depending on your condition and test results.  What to do if you are LOW RISK for COVID-19?  Reduce your risk of any infection by using the same precautions used for avoiding the common cold or flu:  . Wash your hands often with soap and warm water for at least 20 seconds.  If soap and water are not readily available, use an alcohol-based hand sanitizer with at least 60% alcohol.  . If coughing or sneezing, cover your mouth and nose by coughing or sneezing into the elbow areas of your shirt or coat, into a tissue or into your sleeve (not your hands). . Avoid shaking hands with others and consider head nods or verbal greetings only. . Avoid touching your eyes, nose, or mouth with unwashed hands.  . Avoid close contact with people who are sick. . Avoid places or events with large numbers of people in one location, like concerts or sporting events. . Carefully consider travel plans you have or are making. . If you are planning any travel outside or inside the [place], visit the CDC's Travelers' Health webpage for the latest health notices. . If you have some symptoms but not all symptoms, continue to monitor at home and seek medical attention if your symptoms worsen. . If you are having a medical emergency, call 911.   ADDITIONAL HEALTHCARE OPTIONS FOR PATIENTS  [facility] / e-Visit: [email or web address]         [facility]: [phone]  [facility]: [phone]                   [facility]: [phone]   Spoke with pt denies any sx.  Edye Hainline, CMA 

## 2019-02-01 ENCOUNTER — Ambulatory Visit (INDEPENDENT_AMBULATORY_CARE_PROVIDER_SITE_OTHER): Payer: Managed Care, Other (non HMO) | Admitting: Obstetrics and Gynecology

## 2019-02-01 ENCOUNTER — Other Ambulatory Visit: Payer: Self-pay

## 2019-02-01 ENCOUNTER — Encounter: Payer: Self-pay | Admitting: Obstetrics and Gynecology

## 2019-02-01 VITALS — BP 126/75 | HR 61 | Ht 66.0 in | Wt 190.0 lb

## 2019-02-01 DIAGNOSIS — E669 Obesity, unspecified: Secondary | ICD-10-CM | POA: Diagnosis not present

## 2019-02-01 MED ORDER — CYANOCOBALAMIN 1000 MCG/ML IJ SOLN
1000.0000 ug | Freq: Once | INTRAMUSCULAR | Status: AC
  Administered 2019-02-01: 1000 ug via INTRAMUSCULAR

## 2019-02-01 NOTE — Progress Notes (Signed)
Pt is here for wt, bp check, b-12 inj She is doing well, has been at the beach, didn't do to well with weight loss  02/01/19 wt- 190lb 01/04/19 wt- 189lb

## 2019-02-28 ENCOUNTER — Other Ambulatory Visit: Payer: Self-pay

## 2019-02-28 ENCOUNTER — Telehealth: Payer: Self-pay

## 2019-02-28 ENCOUNTER — Ambulatory Visit (INDEPENDENT_AMBULATORY_CARE_PROVIDER_SITE_OTHER): Payer: Managed Care, Other (non HMO) | Admitting: Family Medicine

## 2019-02-28 DIAGNOSIS — R05 Cough: Secondary | ICD-10-CM

## 2019-02-28 DIAGNOSIS — Z20822 Contact with and (suspected) exposure to covid-19: Secondary | ICD-10-CM

## 2019-02-28 DIAGNOSIS — G4700 Insomnia, unspecified: Secondary | ICD-10-CM | POA: Diagnosis not present

## 2019-02-28 DIAGNOSIS — R509 Fever, unspecified: Secondary | ICD-10-CM | POA: Diagnosis not present

## 2019-02-28 DIAGNOSIS — R059 Cough, unspecified: Secondary | ICD-10-CM

## 2019-02-28 DIAGNOSIS — Z20828 Contact with and (suspected) exposure to other viral communicable diseases: Secondary | ICD-10-CM

## 2019-02-28 MED ORDER — TRAZODONE HCL 50 MG PO TABS: ORAL_TABLET | ORAL | 1 refills | Status: DC

## 2019-02-28 NOTE — Progress Notes (Signed)
Patient ID: Kendra Ramos, female   DOB: 1986-07-02, 33 y.o.   MRN: [id number]    Virtual Visit via video Note  This visit type was conducted due to national recommendations for restrictions regarding the COVID-19 pandemic (e.g. social distancing).  This format is felt to be most appropriate for this patient at this time.  All issues noted in this document were discussed and addressed.  No physical exam was performed (except for noted visual exam findings with Video Visits).   I connected with Kendra Ramos today at  3:20 PM EDT by a video enabled telemedicine application and verified that I am speaking with the correct person using two identifiers. Location patient: home Location provider: work or home office Persons participating in the virtual visit: patient, provider  I discussed the limitations, risks, security and privacy concerns of performing an evaluation and management service by telephone and the availability of in person appointments. I also discussed with the patient that there may be a patient responsible charge related to this service. The patient expressed understanding and agreed to proceed.  HPI:  Patient and I connected via video due to cough and fever.  Patient states she has had a dry cough off and on throughout the spring and summer and attributed that to seasonal allergies.  Did do yard work yesterday and was outdoors for many hours mowing and weed whacking.  What prompted her to call to inquire about COVID-19 testing was having fever and chills.  States she would feel very hot and sweaty and then did feel extremely cold.  Patient does work for [employer], but has been working mainly from home.  Denies any SOB or wheezing.  Denies chest pain.  Denies GI or GU complaints.  Denies body aches.  Denies ear pain or headache.  Patient also needs refill of her trazodone.  Uses this to help with insomnia.  Denies any SI or HI.  Overall feels her mood is generally improved  because she gets good sleep.  ROS: See pertinent positives and negatives per HPI.  Past Medical History:  Diagnosis Date  . Anxiety   . Contraception   . Headache   . Vaginal Pap smear, abnormal     Family History  Problem Relation Age of Onset  . Diabetes Mother   . Cancer Mother        skin ca  . Diabetes Father   . Diabetes Maternal Grandmother   . Diabetes Maternal Grandfather   . Diabetes Paternal Grandmother   . Diabetes Paternal Grandfather   . Celiac disease Sister    Social History   Tobacco Use  . Smoking status: Never Smoker  . Smokeless tobacco: Never Used  Substance Use Topics  . Alcohol use: Yes    Alcohol/week: 7.0 standard drinks    Types: 7 Standard drinks or equivalent per week    Current Outpatient Medications:  .  albuterol (PROAIR HFA) 108 (90 Base) MCG/ACT inhaler, Inhale 2 puffs into the lungs every 6 (six) hours as needed for wheezing or shortness of breath., Disp: 6.7 g, Rfl: 2 .  budesonide-formoterol (SYMBICORT) 80-4.5 MCG/ACT inhaler, Inhale 2 puffs into the lungs 2 (two) times daily., Disp: 1 Inhaler, Rfl: 1 .  cyanocobalamin (,VITAMIN B-12,) 1000 MCG/ML injection, INJECT INTRAMUSCULARLY 1ML  EVERY MONTH, Disp: 3 mL, Rfl: 1 .  etonogestrel-ethinyl estradiol (NUVARING) 0.12-0.015 MG/24HR vaginal ring, Insert vaginally and leave in place for 3 consecutive weeks, then remove for 1 week., Disp: 3 each, Rfl: 4 .  loratadine (CLARITIN) 10 MG tablet, Take 10 mg by mouth daily., Disp: , Rfl:  .  LORazepam (ATIVAN) 0.5 MG tablet, TAKE 1 TABLET BY MOUTH  EVERY 8 HOURS, Disp: 60 tablet, Rfl: 4 .  montelukast (SINGULAIR) 10 MG tablet, TAKE 1 TABLET BY MOUTH AT BEDTIME, Disp: 90 tablet, Rfl: 1 .  phentermine (ADIPEX-P) 37.5 MG tablet, Take 1 tablet (37.5 mg total) by mouth daily before breakfast., Disp: 30 tablet, Rfl: 2 .  terconazole (TERAZOL 7) 0.4 % vaginal cream, Place 1 applicator vaginally at bedtime., Disp: 45 g, Rfl: 0 .  traZODone (DESYREL) 50 MG  tablet, TAKE 1/2 TO 1 TABLET BY MOUTH EVERY NIGHT AT BEDTIME AS NEEDED FOR SLEEP, Disp: 90 tablet, Rfl: 0  EXAM:  GENERAL: alert, oriented, appears well and in no acute distress  HEENT: atraumatic, conjunttiva clear, no obvious abnormalities on inspection of external nose and ears  NECK: normal movements of the head and neck  LUNGS: on inspection no signs of respiratory distress, breathing rate appears normal, no obvious gross SOB, gasping or wheezing  CV: no obvious cyanosis  MS: moves all visible extremities without noticeable abnormality  PSYCH/NEURO: pleasant and cooperative, no obvious depression or anxiety, speech and thought processing grossly intact  ASSESSMENT AND PLAN:  Discussed the following assessment and plan:  Suspected COVID-19 virus infection, cough, fever - advised that due to symptoms, we need to get patient set up for COVID-19 testing.  Patient advised that I will put order in and he can go to testing location for for drive-through testing. Patient given the address of testing site.  Patient advised that testing is taking 2 to 7 days to result, and while we are awaiting results patient must remain under self quarantine and monitor for any changing/worsening symptoms.  Advised over-the-counter medications such as Tylenol can be used to help treat pain or fevers, Robitussin can be used to help calm cough, allergy medication such as Claritin or Allegra can help reduce congestion.  Also discussed getting plenty of rest and increasing fluid intake.  Made patient aware that test results as well as how his symptoms progress will determine when the self quarantine will be able to end.  Also advised to monitor self for any worsening symptoms, advised if severe shortness of breath develops, high fever that is not reduced with use of Tylenol, chest pain, severe vomiting or diarrhea  --patient must call on-call and or go to ER right away for evaluation. patient verbalized understanding  of these instructions.  Insomnia -- We will refill patient's trazodone as it is very effective for her.  Does not use every night, but when she does need it it helps her to fall asleep and stay asleep.   I discussed the assessment and treatment plan with the patient. The patient was provided an opportunity to ask questions and all were answered. The patient agreed with the plan and demonstrated an understanding of the instructions.   The patient was advised to call back or seek an in-person evaluation if the symptoms worsen or if the condition fails to improve as anticipated.  Jodelle Green, FNP

## 2019-02-28 NOTE — Telephone Encounter (Signed)
Copied from [place] [id number] [phone]. Topic: Appointment Scheduling - Scheduling Inquiry for Clinic >> Feb 28, 2019  2:21 PM Rayann Heman wrote: Reason for CRM: pt called and stated that she would like an appointment for flu like symptoms

## 2019-02-28 NOTE — Telephone Encounter (Signed)
Called and spoke to pt.  Pt works at [employer].  Patient c/o having flu-like symptoms.  Pt c/o sweating, chills, some body aches (although pt said she is already sore from a bruised tailbone that happened about a week ago), dull headache and a white film in back of throat. Pt believes she may have a fever.  Pt said that she had some SOB on last Saturday when she was weed eating and a dry cough which she thinks may be coming from allergies.  Pt asked about COVID testing.  Pt scheduled a virtual visit with Philis Nettle, NP today at 3:20 pm.

## 2019-03-01 ENCOUNTER — Other Ambulatory Visit: Payer: Self-pay

## 2019-03-01 DIAGNOSIS — Z20822 Contact with and (suspected) exposure to covid-19: Secondary | ICD-10-CM

## 2019-03-02 LAB — NOVEL CORONAVIRUS, NAA: SARS-CoV-2, NAA: NOT DETECTED

## 2019-03-03 DIAGNOSIS — J019 Acute sinusitis, unspecified: Secondary | ICD-10-CM

## 2019-03-03 MED ORDER — AMOXICILLIN-POT CLAVULANATE 875-125 MG PO TABS: 1.0000 | ORAL_TABLET | Freq: Two times a day (BID) | ORAL | 0 refills | Status: DC

## 2019-03-03 NOTE — Telephone Encounter (Signed)
Please schedule a follow up for next week to recheck and see if we can release her back to work  Thanks!  LG

## 2019-03-06 DIAGNOSIS — R059 Cough, unspecified: Secondary | ICD-10-CM

## 2019-03-06 DIAGNOSIS — R05 Cough: Secondary | ICD-10-CM

## 2019-03-06 MED ORDER — HYDROCOD POLST-CPM POLST ER 10-8 MG/5ML PO SUER: 5.0000 mL | Freq: Two times a day (BID) | ORAL | 0 refills | Status: DC | PRN

## 2019-03-07 ENCOUNTER — Other Ambulatory Visit: Payer: Self-pay | Admitting: Internal Medicine

## 2019-03-07 ENCOUNTER — Ambulatory Visit
Admission: RE | Admit: 2019-03-07 | Discharge: 2019-03-07 | Disposition: A | Discharge status: Home / Self Care | Payer: Managed Care, Other (non HMO) | Source: Ambulatory Visit | Attending: Internal Medicine | Admitting: Internal Medicine

## 2019-03-07 DIAGNOSIS — J988 Other specified respiratory disorders: Secondary | ICD-10-CM

## 2019-03-10 ENCOUNTER — Ambulatory Visit (INDEPENDENT_AMBULATORY_CARE_PROVIDER_SITE_OTHER): Payer: Managed Care, Other (non HMO) | Admitting: Internal Medicine

## 2019-03-10 ENCOUNTER — Encounter: Payer: Self-pay | Admitting: Internal Medicine

## 2019-03-10 ENCOUNTER — Other Ambulatory Visit: Payer: Self-pay

## 2019-03-10 DIAGNOSIS — J181 Lobar pneumonia, unspecified organism: Secondary | ICD-10-CM | POA: Diagnosis not present

## 2019-03-10 DIAGNOSIS — J189 Pneumonia, unspecified organism: Secondary | ICD-10-CM | POA: Insufficient documentation

## 2019-03-10 MED ORDER — GUAIFENESIN-CODEINE 100-10 MG/5ML PO SYRP: 10.0000 mL | ORAL_SOLUTION | Freq: Three times a day (TID) | ORAL | 0 refills | Status: DC | PRN

## 2019-03-10 MED ORDER — AZITHROMYCIN 500 MG PO TABS: 500.0000 mg | ORAL_TABLET | Freq: Every day | ORAL | 0 refills | Status: DC

## 2019-03-10 MED ORDER — FLUCONAZOLE 150 MG PO TABS: 150.0000 mg | ORAL_TABLET | Freq: Every day | ORAL | 0 refills | Status: DC

## 2019-03-10 MED ORDER — PREDNISONE 10 MG PO TABS: ORAL_TABLET | ORAL | 0 refills | Status: DC

## 2019-03-10 MED ORDER — ALBUTEROL SULFATE HFA 108 (90 BASE) MCG/ACT IN AERS: 2.0000 | INHALATION_SPRAY | Freq: Four times a day (QID) | RESPIRATORY_TRACT | 2 refills | Status: DC | PRN

## 2019-03-10 NOTE — Assessment & Plan Note (Addendum)
Not responding to augmentin  Day 7 .  COVID 19 may have been a false negative result.  Changing coverage to azithromycin to address atypical PNA and possible COVID 19.  Adding steroids and albuterol for wheezing, cheratussin for severe cough. Advised to purchase a pulse oximeter and to seek immediate treatment in ER for sats < 90%

## 2019-03-10 NOTE — Progress Notes (Signed)
Virtual Visit via [email or web address]  This visit type was conducted due to national recommendations for restrictions regarding the COVID-19 pandemic (e.g. social distancing).  This format is felt to be most appropriate for this patient at this time.  All issues noted in this document were discussed and addressed.  No physical exam was performed (except for noted visual exam findings with Video Visits).   I connected with@ on 03/10/19 at 11:30 AM EDT by a video enabled telemedicine application or telephone and verified that I am speaking with the correct person using two identifiers. Location patient: home Location provider: work or home office Persons participating in the virtual visit: patient, provider  I discussed the limitations, risks, security and privacy concerns of performing an evaluation and management service by telephone and the availability of in person appointments. I also discussed with the patient that there may be a patient responsible charge related to this service. The patient expressed understanding and agreed to proceed.  Reason for visit: pneumonia follow up  HPI:   33 yr old female treated for cough and fever that started on July 27   Treated for VIRAL uri ON July 28 BY [facility]; suspected COVID 19  BUT TEST WAS NEGATVE.  SYMPTOMS PERSISTED WAS SENT FOR CHEST X RAY  ON AUGUST 4 WHICH NOTED  RUL INFILTRATE AND POSSIBLE EARLY INFILTRATE IN THE RLL . AUGMENTIN  STARTED ON July 31, continues to feel poorly . Chest is tight,  Pain in right upper lobe/clavicular area   Severely fatigued, no appetite,  Weight loss of 10 lbs , wheezing after paroxsyms of cough.    ROS: See pertinent positives and negatives per HPI.  Past Medical History:  Diagnosis Date  . Anxiety   . Contraception   . Headache   . Vaginal Pap smear, abnormal     No past surgical history on file.  Family History  Problem Relation Age of Onset  . Diabetes Mother   . Cancer Mother        skin ca  . Diabetes Father   .  Diabetes Maternal Grandmother   . Diabetes Maternal Grandfather   . Diabetes Paternal Grandmother   . Diabetes Paternal Grandfather   . Celiac disease Sister     SOCIAL HX:  reports that she has never smoked. She has never used smokeless tobacco. She reports current alcohol use of about 7.0 standard drinks of alcohol per week. She reports that she does not use drugs.   Current Outpatient Medications:  .  amoxicillin-clavulanate (AUGMENTIN) 875-125 MG tablet, Take 1 tablet by mouth 2 (two) times daily., Disp: 20 tablet, Rfl: 0 .  chlorpheniramine-HYDROcodone (TUSSIONEX PENNKINETIC ER) 10-8 MG/5ML SUER, Take 5 mLs by mouth every 12 (twelve) hours as needed., Disp: 140 mL, Rfl: 0 .  cyanocobalamin (,VITAMIN B-12,) 1000 MCG/ML injection, INJECT INTRAMUSCULARLY 1ML  EVERY MONTH, Disp: 3 mL, Rfl: 1 .  etonogestrel-ethinyl estradiol (NUVARING) 0.12-0.015 MG/24HR vaginal ring, Insert vaginally and leave in place for 3 consecutive weeks, then remove for 1 week., Disp: 3 each, Rfl: 4 .  loratadine (CLARITIN) 10 MG tablet, Take 10 mg by mouth daily., Disp: , Rfl:  .  LORazepam (ATIVAN) 0.5 MG tablet, TAKE 1 TABLET BY MOUTH  EVERY 8 HOURS, Disp: 60 tablet, Rfl: 4 .  montelukast (SINGULAIR) 10 MG tablet, TAKE 1 TABLET BY MOUTH AT BEDTIME, Disp: 90 tablet, Rfl: 1 .  terconazole (TERAZOL 7) 0.4 % vaginal cream, Place 1 applicator vaginally at bedtime., Disp: 45 g, Rfl: 0 .  traZODone (DESYREL) 50 MG tablet, TAKE 1/2 TO 1 TABLET BY MOUTH EVERY NIGHT AT BEDTIME AS NEEDED FOR SLEEP, Disp: 90 tablet, Rfl: 1 .  albuterol (PROAIR HFA) 108 (90 Base) MCG/ACT inhaler, Inhale 2 puffs into the lungs every 6 (six) hours as needed for wheezing or shortness of breath., Disp: 6.7 g, Rfl: 2 .  azithromycin (ZITHROMAX) 500 MG tablet, Take 1 tablet (500 mg total) by mouth daily., Disp: 7 tablet, Rfl: 0 .  budesonide-formoterol (SYMBICORT) 80-4.5 MCG/ACT inhaler, Inhale 2 puffs into the lungs 2 (two) times daily. (Patient not  taking: Reported on 03/10/2019), Disp: 1 Inhaler, Rfl: 1 .  fluconazole (DIFLUCAN) 150 MG tablet, Take 1 tablet (150 mg total) by mouth daily., Disp: 2 tablet, Rfl: 0 .  guaiFENesin-codeine (CHERATUSSIN AC) 100-10 MG/5ML syrup, Take 10 mLs by mouth 3 (three) times daily as needed for cough., Disp: 210 mL, Rfl: 0 .  phentermine (ADIPEX-P) 37.5 MG tablet, Take 1 tablet (37.5 mg total) by mouth daily before breakfast. (Patient not taking: Reported on 03/10/2019), Disp: 30 tablet, Rfl: 2 .  predniSONE (DELTASONE) 10 MG tablet, 6 tablets on Day 1 , then reduce by 1 tablet daily until gone, Disp: 21 tablet, Rfl: 0  EXAM:  VITALS per patient if applicable:  GENERAL: alert, oriented, appears sick  But not in  acute distress  HEENT: atraumatic, conjunttiva clear, no obvious abnormalities on inspection of external nose and ears  NECK: normal movements of the head and neck  LUNGS: on inspection no signs of respiratory distress, breathing rate appears normal,  Speaking in full sentences but  Stopped by paroxysms    CV: no obvious cyanosis  MS: moves all visible extremities without noticeable abnormality  PSYCH/NEURO: pleasant and cooperative, no obvious depression or anxiety, speech and thought processing grossly intact  ASSESSMENT AND PLAN:  Discussed the following assessment and plan:  Pneumonia of right upper lobe due to infectious organism [facility]) Not responding to augmentin  Day 7 .  COVID 19 may have been a false negative result.  Changing coverage to azithromycin to address atypical PNA and possible COVID 19.  Adding steroids and albuterol for wheezing, cheratussin for severe cough. Advised to purchase a pulse oximeter and to seek immediate treatment in ER for sats < 90%      I discussed the assessment and treatment plan with the patient. The patient was provided an opportunity to ask questions and all were answered. The patient agreed with the plan and demonstrated an understanding of the  instructions.   The patient was advised to call back or seek an in-person evaluation if the symptoms worsen or if the condition fails to improve as anticipated.  I provided 25 minutes of non-face-to-face time during this encounter.   Crecencio Mc, MD

## 2019-03-10 NOTE — Patient Instructions (Addendum)
I suspect you have COVID 19 in spite of your negative tes.    Stop the augmentin once you start the azithromycin  Start the prednisone taper asap  CHERATUSSIN cough syrup (less strong than tussionex)  Zinc supplements:    zinc gluconate  ("Cold eeze:) 13.3 mg lozenge   Max 6 daily  Or  Zinc acetate: 50 mg three times daily  Have your mom in law pick up a pulse oximeter .  If your oxygen level is < 90% ,  GO TO ER

## 2019-03-16 ENCOUNTER — Encounter: Payer: Self-pay | Admitting: Family Medicine

## 2019-03-16 ENCOUNTER — Ambulatory Visit (INDEPENDENT_AMBULATORY_CARE_PROVIDER_SITE_OTHER): Payer: Managed Care, Other (non HMO) | Admitting: Family Medicine

## 2019-03-16 ENCOUNTER — Other Ambulatory Visit: Payer: Self-pay

## 2019-03-16 DIAGNOSIS — J181 Lobar pneumonia, unspecified organism: Secondary | ICD-10-CM

## 2019-03-16 DIAGNOSIS — R059 Cough, unspecified: Secondary | ICD-10-CM

## 2019-03-16 DIAGNOSIS — J189 Pneumonia, unspecified organism: Secondary | ICD-10-CM

## 2019-03-16 DIAGNOSIS — R05 Cough: Secondary | ICD-10-CM

## 2019-03-16 DIAGNOSIS — J988 Other specified respiratory disorders: Secondary | ICD-10-CM | POA: Diagnosis not present

## 2019-03-16 MED ORDER — PREDNISONE 10 MG PO TABS: ORAL_TABLET | ORAL | 0 refills | Status: DC

## 2019-03-16 NOTE — Progress Notes (Signed)
Patient ID: Kendra Ramos, female   DOB: 1985-11-25, 33 y.o.   MRN: [id number]   Virtual Visit via video Note  This visit type was conducted due to national recommendations for restrictions regarding the COVID-19 pandemic (e.g. social distancing).  This format is felt to be most appropriate for this patient at this time.  All issues noted in this document were discussed and addressed.  No physical exam was performed (except for noted visual exam findings with Video Visits).   I connected with Albertine Grates today at  8:40 AM EDT by a video enabled telemedicine application and verified that I am speaking with the correct person using two identifiers. Location patient: home Location provider: work or home office Persons participating in the virtual visit: patient, provider  I discussed the limitations, risks, security and privacy concerns of performing an evaluation and management service by video and the availability of in person appointments. I also discussed with the patient that there may be a patient responsible charge related to this service. The patient expressed understanding and agreed to proceed.    HPI:  Patient and I connected via video to follow-up on cough, respiratory infection and right upper lobe pneumonia.  Patient originally was seen on 7/28 due to suspected COVID-19.  COVID-19 testing was negative however continued to have cough congestion felt really rundown.  She was treated with course of Augmentin.  Continue to feel unwell so was treated with oral steroid taper, and azithromycin course on 8/7.  Cough seems somewhat better, but does at times have harsh coughing fits that really take her energy level down.  She tries to use her Symbicort inhaler twice a day as prescribed, but sometimes states cannot tolerate it.  Is not coughing up thick phlegm with cough any longer.  Energy level slowly is improving as the days go on.  No more fevers.  No nausea, vomiting or diarrhea.  No  body aches.  Mucinex does seem to help cough. Uses cough syrup at night before bed.    ROS: See pertinent positives and negatives per HPI.  Past Medical History:  Diagnosis Date  . Anxiety   . Contraception   . Headache   . Vaginal Pap smear, abnormal    No past surgical history on file.  Family History  Problem Relation Age of Onset  . Diabetes Mother   . Cancer Mother        skin ca  . Diabetes Father   . Diabetes Maternal Grandmother   . Diabetes Maternal Grandfather   . Diabetes Paternal Grandmother   . Diabetes Paternal Grandfather   . Celiac disease Sister     Current Outpatient Medications:  .  albuterol (PROAIR HFA) 108 (90 Base) MCG/ACT inhaler, Inhale 2 puffs into the lungs every 6 (six) hours as needed for wheezing or shortness of breath., Disp: 6.7 g, Rfl: 2 .  budesonide-formoterol (SYMBICORT) 80-4.5 MCG/ACT inhaler, Inhale 2 puffs into the lungs 2 (two) times daily., Disp: 1 Inhaler, Rfl: 1 .  cyanocobalamin (,VITAMIN B-12,) 1000 MCG/ML injection, INJECT INTRAMUSCULARLY 1ML  EVERY MONTH, Disp: 3 mL, Rfl: 1 .  etonogestrel-ethinyl estradiol (NUVARING) 0.12-0.015 MG/24HR vaginal ring, Insert vaginally and leave in place for 3 consecutive weeks, then remove for 1 week., Disp: 3 each, Rfl: 4 .  fluconazole (DIFLUCAN) 150 MG tablet, Take 1 tablet (150 mg total) by mouth daily., Disp: 2 tablet, Rfl: 0 .  guaiFENesin-codeine (CHERATUSSIN AC) 100-10 MG/5ML syrup, Take 10 mLs by mouth 3 (three) times  daily as needed for cough., Disp: 210 mL, Rfl: 0 .  loratadine (CLARITIN) 10 MG tablet, Take 10 mg by mouth daily., Disp: , Rfl:  .  LORazepam (ATIVAN) 0.5 MG tablet, TAKE 1 TABLET BY MOUTH  EVERY 8 HOURS, Disp: 60 tablet, Rfl: 4 .  montelukast (SINGULAIR) 10 MG tablet, TAKE 1 TABLET BY MOUTH AT BEDTIME, Disp: 90 tablet, Rfl: 1 .  phentermine (ADIPEX-P) 37.5 MG tablet, Take 1 tablet (37.5 mg total) by mouth daily before breakfast., Disp: 30 tablet, Rfl: 2 .  predniSONE  (DELTASONE) 10 MG tablet, 6 tablets on Day 1 , then reduce by 1 tablet daily until gone, Disp: 21 tablet, Rfl: 0 .  terconazole (TERAZOL 7) 0.4 % vaginal cream, Place 1 applicator vaginally at bedtime., Disp: 45 g, Rfl: 0 .  traZODone (DESYREL) 50 MG tablet, TAKE 1/2 TO 1 TABLET BY MOUTH EVERY NIGHT AT BEDTIME AS NEEDED FOR SLEEP, Disp: 90 tablet, Rfl: 1  EXAM:  GENERAL: alert, oriented, appears well and in no acute distress  HEENT: atraumatic, conjunttiva clear, no obvious abnormalities on inspection of external nose and ears. +Voice sounds scratchy, voice sounds improve after cough  NECK: normal movements of the head and neck  LUNGS: on inspection no signs of respiratory distress, breathing rate appears normal, no obvious gross SOB, gasping or wheezing. +harsh raspy cough at times  CV: no obvious cyanosis  MS: moves all visible extremities without noticeable abnormality  PSYCH/NEURO: pleasant and cooperative, no obvious depression or anxiety, speech and thought processing grossly intact  ASSESSMENT AND PLAN:  Discussed the following assessment and plan:  Pneumonia, respiratory infection, cough- patient will finish last dose of azithromycin today.  She will do another oral steroid taper.  Encouraged to take Symbicort twice a day as prescribed and if not able to tolerate, advised to at least try taking 2 puffs of albuterol inhaler 3-4 times per day to help keep lungs open.  Over the next week, we will continue to monitor symptoms for improvement.  Encouraged patient that she is slowly doing better and oftentimes this can have both pneumonia and coughing, they can take a while to resolve but steady improvement is our goal.  She will continue to keep up good fluid intake, take mucinex BID, use cough syrup at night PRN. Advised if symptoms worsen in any way to call office and let us know.  Advised if unable to catch breath at that point she must call 911 or go to ER for evaluation.  We will  plan to touch base again early next week for continued follow-up. She will send MyChart message with how she is doing.    I discussed the assessment and treatment plan with the patient. The patient was provided an opportunity to ask questions and all were answered. The patient agreed with the plan and demonstrated an understanding of the instructions.   The patient was advised to call back or seek an in-person evaluation if the symptoms worsen or if the condition fails to improve as anticipated.  Jodelle Green, FNP

## 2019-04-05 ENCOUNTER — Other Ambulatory Visit: Payer: Self-pay | Admitting: Internal Medicine

## 2019-04-05 DIAGNOSIS — J189 Pneumonia, unspecified organism: Secondary | ICD-10-CM

## 2019-04-05 DIAGNOSIS — R05 Cough: Secondary | ICD-10-CM

## 2019-04-05 DIAGNOSIS — J988 Other specified respiratory disorders: Secondary | ICD-10-CM

## 2019-04-05 DIAGNOSIS — R059 Cough, unspecified: Secondary | ICD-10-CM

## 2019-04-05 MED ORDER — GUAIFENESIN-CODEINE 100-10 MG/5ML PO SYRP: 10.0000 mL | ORAL_SOLUTION | Freq: Three times a day (TID) | ORAL | 0 refills | Status: DC | PRN

## 2019-04-05 MED ORDER — PREDNISONE 10 MG PO TABS: ORAL_TABLET | ORAL | 0 refills | Status: DC

## 2019-05-09 ENCOUNTER — Other Ambulatory Visit: Payer: Self-pay

## 2019-05-11 ENCOUNTER — Encounter: Payer: Self-pay | Admitting: Internal Medicine

## 2019-05-11 ENCOUNTER — Ambulatory Visit: Payer: Managed Care, Other (non HMO) | Admitting: Internal Medicine

## 2019-05-11 ENCOUNTER — Other Ambulatory Visit: Payer: Self-pay

## 2019-05-11 ENCOUNTER — Ambulatory Visit (INDEPENDENT_AMBULATORY_CARE_PROVIDER_SITE_OTHER): Payer: Managed Care, Other (non HMO)

## 2019-05-11 VITALS — BP 110/72 | HR 71 | Temp 97.3°F | Resp 15 | Ht 66.0 in | Wt 188.2 lb

## 2019-05-11 DIAGNOSIS — J189 Pneumonia, unspecified organism: Secondary | ICD-10-CM

## 2019-05-11 DIAGNOSIS — R5383 Other fatigue: Secondary | ICD-10-CM

## 2019-05-11 DIAGNOSIS — Z13220 Encounter for screening for lipoid disorders: Secondary | ICD-10-CM | POA: Diagnosis not present

## 2019-05-11 DIAGNOSIS — E66811 Obesity, class 1: Secondary | ICD-10-CM

## 2019-05-11 DIAGNOSIS — E669 Obesity, unspecified: Secondary | ICD-10-CM

## 2019-05-11 NOTE — Patient Instructions (Signed)
Labs to be done at [facility] include the antibody test for COVID 19  If chest x ray is unrevealing,  cT chest advised (and follow up with Raul Del)  Ask Melody to order baseline mammogram at your annual this year

## 2019-05-11 NOTE — Progress Notes (Signed)
Subjective:  Patient ID: Kendra Ramos, female    DOB: [birth year]  Age: 33 y.o. MRN: [id number]  CC: The primary encounter diagnosis was Pneumonia of left lung due to infectious organism, unspecified part of lung. Diagnoses of Pneumonia of right upper lobe due to infectious organism, Fatigue, unspecified type, Screening for hyperlipidemia, and Obesity (BMI 30.0-34.9) were also pertinent to this visit.  HPI [name] presents for follow up on pneumonia diagnosed in August by chest x ray,   Treated with 2 rounds of antibiotics , steroids and albuterol.  COVID TEST WAS NEGATIVE  BUT SHE FELT lethargic  FOR 2 MONTHS  AND IS STILL having a productive cough with white to yellow sputum of a quality described as chunky/ viscous.   Her husband is a Development worker, community  Who does not wear a prootective mask on a regular basis despite going in and out of homes,  and  refused to isolate from her or wear a mask when she was sick. Marland Kitchen  He did not  become ill. (asymptomatic carrier?); neither did her 44 yr old daughter    She has a history of asthma and has been using symbicort regularly.  Has not seen Pulmonology since February   OVERWEIGHT PERSONAL BEST 174 LBS IN April 2018 . Has not exercised In months due to covid   10 yr old daughter starting to walk with her   Outpatient Medications Prior to Visit  Medication Sig Dispense Refill  . albuterol (PROAIR HFA) 108 (90 Base) MCG/ACT inhaler Inhale 2 puffs into the lungs every 6 (six) hours as needed for wheezing or shortness of breath. 6.7 g 2  . budesonide-formoterol (SYMBICORT) 80-4.5 MCG/ACT inhaler Inhale 2 puffs into the lungs 2 (two) times daily. 1 Inhaler 1  . cyanocobalamin (,VITAMIN B-12,) 1000 MCG/ML injection INJECT INTRAMUSCULARLY 1ML  EVERY MONTH 3 mL 1  . etonogestrel-ethinyl estradiol (NUVARING) 0.12-0.015 MG/24HR vaginal ring Insert vaginally and leave in place for 3 consecutive weeks, then remove for 1 week. 3 each 4  . loratadine (CLARITIN) 10 MG  tablet Take 10 mg by mouth daily.    Marland Kitchen LORazepam (ATIVAN) 0.5 MG tablet TAKE 1 TABLET BY MOUTH  EVERY 8 HOURS 60 tablet 4  . montelukast (SINGULAIR) 10 MG tablet TAKE 1 TABLET BY MOUTH AT BEDTIME 90 tablet 1  . terconazole (TERAZOL 7) 0.4 % vaginal cream Place 1 applicator vaginally at bedtime. 45 g 0  . traZODone (DESYREL) 50 MG tablet TAKE 1/2 TO 1 TABLET BY MOUTH EVERY NIGHT AT BEDTIME AS NEEDED FOR SLEEP 90 tablet 1  . fluconazole (DIFLUCAN) 150 MG tablet Take 1 tablet (150 mg total) by mouth daily. (Patient not taking: Reported on 05/11/2019) 2 tablet 0  . guaiFENesin-codeine (CHERATUSSIN AC) 100-10 MG/5ML syrup Take 10 mLs by mouth 3 (three) times daily as needed for cough. (Patient not taking: Reported on 05/11/2019) 210 mL 0  . phentermine (ADIPEX-P) 37.5 MG tablet Take 1 tablet (37.5 mg total) by mouth daily before breakfast. (Patient not taking: Reported on 05/11/2019) 30 tablet 2  . predniSONE (DELTASONE) 10 MG tablet 6 tablets on Day 1 , then reduce by 1 tablet daily until gone (Patient not taking: Reported on 05/11/2019) 21 tablet 0   No facility-administered medications prior to visit.     Review of Systems;  Patient denies headache, fevers, malaise, unintentional weight loss, skin rash, eye pain, sinus congestion and sinus pain, sore throat, dysphagia,  hemoptysis , cough, dyspnea, wheezing, chest pain, palpitations,  orthopnea, edema, abdominal pain, nausea, melena, diarrhea, constipation, flank pain, dysuria, hematuria, urinary  Frequency, nocturia, numbness, tingling, seizures,  Focal weakness, Loss of consciousness,  Tremor, insomnia, depression, anxiety, and suicidal ideation.      Objective:  BP 110/72 (BP Location: Left Arm, Patient Position: Sitting, Cuff Size: Normal)   Pulse 71   Temp (!) 97.3 F (36.3 C) (Temporal)   Resp 15   Ht 5\' 6"  (1.676 m)   Wt 188 lb 3.2 oz (85.4 kg)   SpO2 99%   BMI 30.38 kg/m   BP Readings from Last 3 Encounters:  05/11/19 110/72   02/01/19 126/75  01/04/19 101/82    Wt Readings from Last 3 Encounters:  05/11/19 188 lb 3.2 oz (85.4 kg)  02/01/19 190 lb (86.2 kg)  01/04/19 189 lb 6.4 oz (85.9 kg)    General appearance: alert, cooperative and appears stated age Ears: normal TM's and external ear canals both ears Throat: lips, mucosa, and tongue normal; teeth and gums normal Neck: no adenopathy, no carotid bruit, supple, symmetrical, trachea midline and thyroid not enlarged, symmetric, no tenderness/mass/nodules Back: symmetric, no curvature. ROM normal. No CVA tenderness. Lungs: clear to auscultation bilaterally Heart: regular rate and rhythm, S1, S2 normal, no murmur, click, rub or gallop Abdomen: soft, non-tender; bowel sounds normal; no masses,  no organomegaly Pulses: 2+ and symmetric Skin: Skin color, texture, turgor normal. No rashes or lesions Lymph nodes: Cervical, supraclavicular, and axillary nodes normal.  Lab Results  Component Value Date   HGBA1C 5.1 03/25/2016   HGBA1C 5.5 03/22/2015   HGBA1C 5.2 07/04/2013    Lab Results  Component Value Date   CREATININE 0.86 05/11/2019   CREATININE 0.87 05/18/2018   CREATININE 0.83 01/29/2017    Lab Results  Component Value Date   WBC 6.8 05/11/2019   HGB 12.6 05/11/2019   HCT 38.6 05/11/2019   PLT 273 05/11/2019   GLUCOSE 100 (H) 05/11/2019   CHOL 179 05/11/2019   TRIG 53 05/11/2019   HDL 95 05/11/2019   LDLCALC 73 05/11/2019   ALT 16 05/11/2019   AST 21 05/11/2019   NA 139 05/11/2019   K 3.8 05/11/2019   CL 103 05/11/2019   CREATININE 0.86 05/11/2019   BUN 10 05/11/2019   CO2 24 05/11/2019   TSH 1.220 05/11/2019   HGBA1C 5.1 03/25/2016    Dg Chest 2 View  Result Date: 03/07/2019 CLINICAL DATA:  Cough and fever EXAM: CHEST - 2 VIEW COMPARISON:  11/30/2016 FINDINGS: Right upper lobe pneumonia. There may be small focus of infiltrate in the right lower lobe as well. Normal heart size. No pneumothorax. IMPRESSION: Right upper lobe  pneumonia with possible small focus of infiltrate at the right lower lobe. Electronically Signed   By: Donavan Foil M.D.   On: 03/07/2019 22:26    Assessment & Plan:   Problem List Items Addressed This Visit      Unprioritized   Obesity (BMI 30.0-34.9)    I have congratulated her in reduction of   BMI and encouraged  Continued weight loss with goal of 10% of body weigh over the next 6 months using a low glycemic index diet and regular exercise a minimum of 5 days per week.        Pneumonia of right upper lobe due to infectious organism    Etiology presumed to be COVID despite  negative test due to protracted illness that did not respond to antibiotics.  Antibody test ordered.  Chest x ray  now normal       Relevant Orders   SAR CoV2 Serology (COVID 19)AB(IGG)IA (Completed)   DG Chest 2 View (Completed)    Other Visit Diagnoses    Pneumonia of left lung due to infectious organism, unspecified part of lung    -  Primary   Fatigue, unspecified type       Relevant Orders   Comprehensive metabolic panel (Completed)   CBC with Differential/Platelet (Completed)   TSH (Completed)   B12 and Folate Panel (Completed)   Screening for hyperlipidemia       Relevant Orders   Lipid panel (Completed)      I have discontinued Renato Gails. Benevides's phentermine, fluconazole, predniSONE, and guaiFENesin-codeine. I am also having her maintain her loratadine, budesonide-formoterol, etonogestrel-ethinyl estradiol, terconazole, LORazepam, montelukast, cyanocobalamin, traZODone, and albuterol.  No orders of the defined types were placed in this encounter.   Medications Discontinued During This Encounter  Medication Reason  . fluconazole (DIFLUCAN) 150 MG tablet Patient has not taken in last 30 days  . guaiFENesin-codeine (CHERATUSSIN AC) 100-10 MG/5ML syrup Patient has not taken in last 30 days  . phentermine (ADIPEX-P) 37.5 MG tablet Patient has not taken in last 30 days  . predniSONE (DELTASONE)  10 MG tablet Patient has not taken in last 30 days  A total of 25 minutes of face to face time was spent with patient more than half of which was spent in counselling about the above mentioned conditions  and coordination of care   Follow-up: No follow-ups on file.   Crecencio Mc, MD

## 2019-05-12 LAB — CBC WITH DIFFERENTIAL/PLATELET
Basophils Absolute: 0 10*3/uL (ref 0.0–0.2)
Basos: 0 %
EOS (ABSOLUTE): 0 10*3/uL (ref 0.0–0.4)
Eos: 0 %
Hematocrit: 38.6 % (ref 34.0–46.6)
Hemoglobin: 12.6 g/dL (ref 11.1–15.9)
Immature Grans (Abs): 0 10*3/uL (ref 0.0–0.1)
Immature Granulocytes: 0 %
Lymphocytes Absolute: 1.9 10*3/uL (ref 0.7–3.1)
Lymphs: 29 %
MCH: 29.6 pg (ref 26.6–33.0)
MCHC: 32.6 g/dL (ref 31.5–35.7)
MCV: 91 fL (ref 79–97)
Monocytes Absolute: 0.3 10*3/uL (ref 0.1–0.9)
Monocytes: 5 %
Neutrophils Absolute: 4.5 10*3/uL (ref 1.4–7.0)
Neutrophils: 66 %
Platelets: 273 10*3/uL (ref 150–450)
RBC: 4.25 x10E6/uL (ref 3.77–5.28)
RDW: 12.7 % (ref 11.7–15.4)
WBC: 6.8 10*3/uL (ref 3.4–10.8)

## 2019-05-12 LAB — LIPID PANEL
Chol/HDL Ratio: 1.9 ratio (ref 0.0–4.4)
Cholesterol, Total: 179 mg/dL (ref 100–199)
HDL: 95 mg/dL (ref >39)
LDL Chol Calc (NIH): 73 mg/dL (ref 0–99)
Triglycerides: 53 mg/dL (ref 0–149)
VLDL Cholesterol Cal: 11 mg/dL (ref 5–40)

## 2019-05-12 LAB — COMPREHENSIVE METABOLIC PANEL
ALT: 16 IU/L (ref 0–32)
AST: 21 IU/L (ref 0–40)
Albumin/Globulin Ratio: 1.8 (ref 1.2–2.2)
Albumin: 4.3 g/dL (ref 3.8–4.8)
Alkaline Phosphatase: 41 IU/L (ref 39–117)
BUN/Creatinine Ratio: 12 (ref 9–23)
BUN: 10 mg/dL (ref 6–20)
Bilirubin Total: 0.3 mg/dL (ref 0.0–1.2)
CO2: 24 mmol/L (ref 20–29)
Calcium: 9.3 mg/dL (ref 8.7–10.2)
Chloride: 103 mmol/L (ref 96–106)
Creatinine, Ser: 0.86 mg/dL (ref 0.57–1.00)
GFR calc Af Amer: 101 mL/min/{1.73_m2} (ref >59)
GFR calc non Af Amer: 88 mL/min/{1.73_m2} (ref >59)
Globulin, Total: 2.4 g/dL (ref 1.5–4.5)
Glucose: 100 mg/dL — ABNORMAL HIGH (ref 65–99)
Potassium: 3.8 mmol/L (ref 3.5–5.2)
Sodium: 139 mmol/L (ref 134–144)
Total Protein: 6.7 g/dL (ref 6.0–8.5)

## 2019-05-12 LAB — SAR COV2 SEROLOGY (COVID19)AB(IGG),IA

## 2019-05-12 LAB — TSH: TSH: 1.22 u[IU]/mL (ref 0.450–4.500)

## 2019-05-12 LAB — B12 AND FOLATE PANEL
Folate: 9.2 ng/mL (ref >3.0)
Vitamin B-12: 444 pg/mL (ref 232–1245)

## 2019-05-12 LAB — EUROIMMUN SARS-COV-2 AB, IGG: Euroimmun SARS-CoV-2 Ab, IgG: NEGATIVE

## 2019-05-13 NOTE — Assessment & Plan Note (Signed)
I have congratulated her in reduction of   BMI and encouraged  Continued weight loss with goal of 10% of body weigh over the next 6 months using a low glycemic index diet and regular exercise a minimum of 5 days per week.    

## 2019-05-13 NOTE — Assessment & Plan Note (Signed)
Etiology presumed to be COVID despite  negative test due to protracted illness that did not respond to antibiotics.  Antibody test ordered.  Chest x ray now normal

## 2019-05-25 ENCOUNTER — Other Ambulatory Visit: Payer: Self-pay

## 2019-05-25 MED ORDER — MONTELUKAST SODIUM 10 MG PO TABS: 10.0000 mg | ORAL_TABLET | Freq: Every day | ORAL | 1 refills | Status: DC

## 2019-08-24 ENCOUNTER — Other Ambulatory Visit: Payer: Self-pay

## 2019-08-24 DIAGNOSIS — G4700 Insomnia, unspecified: Secondary | ICD-10-CM

## 2019-08-24 MED ORDER — TRAZODONE HCL 50 MG PO TABS: ORAL_TABLET | ORAL | 1 refills | Status: DC

## 2019-10-23 ENCOUNTER — Encounter: Payer: Self-pay | Admitting: Certified Nurse Midwife

## 2019-10-23 ENCOUNTER — Ambulatory Visit (INDEPENDENT_AMBULATORY_CARE_PROVIDER_SITE_OTHER): Payer: Managed Care, Other (non HMO) | Admitting: Certified Nurse Midwife

## 2019-10-23 ENCOUNTER — Other Ambulatory Visit: Payer: Self-pay

## 2019-10-23 VITALS — BP 99/77 | HR 66 | Ht 66.0 in | Wt 204.3 lb

## 2019-10-23 DIAGNOSIS — Z3044 Encounter for surveillance of vaginal ring hormonal contraceptive device: Secondary | ICD-10-CM

## 2019-10-23 DIAGNOSIS — Z01419 Encounter for gynecological examination (general) (routine) without abnormal findings: Secondary | ICD-10-CM | POA: Diagnosis not present

## 2019-10-23 MED ORDER — ETONOGESTREL-ETHINYL ESTRADIOL 0.12-0.015 MG/24HR VA RING: VAGINAL_RING | VAGINAL | 4 refills | Status: DC

## 2019-10-23 NOTE — Progress Notes (Signed)
Pt present for annual exam. Pt stated that she was doing well. Pt needs a refill of Nuvaring.

## 2019-10-23 NOTE — Progress Notes (Signed)
ANNUAL PREVENTATIVE CARE GYN  ENCOUNTER NOTE  Subjective:       Kendra Ramos is a 34 y.o. G1P0 female here for a routine annual gynecologic exam.  Current complaints: 1. Needs Nuvaring refill  Denies difficulty breathing or respiratory distress, chest pain, abdominal pain, excessive vaginal bleeding, dysuria, and leg pain or swelling.    Gynecologic History  Patient's last menstrual period was 10/04/2019.  Contraception: NuvaRing vaginal inserts  Last Pap: 05/2018. Results were: normal  Obstetric History  OB History  Gravida Para Term Preterm AB Living  1         1  SAB TAB Ectopic Multiple Live Births          1    # Outcome Date GA Lbr Len/2nd Weight Sex Delivery Anes PTL Lv  1 Gravida 2015    F Vag-Spont   LIV    Past Medical History:  Diagnosis Date  . Anxiety   . Contraception   . Headache   . Vaginal Pap smear, abnormal     History reviewed. No pertinent surgical history.  Current Outpatient Medications on File Prior to Visit  Medication Sig Dispense Refill  . albuterol (PROAIR HFA) 108 (90 Base) MCG/ACT inhaler Inhale 2 puffs into the lungs every 6 (six) hours as needed for wheezing or shortness of breath. 6.7 g 2  . budesonide-formoterol (SYMBICORT) 80-4.5 MCG/ACT inhaler Inhale 2 puffs into the lungs 2 (two) times daily. 1 Inhaler 1  . loratadine (CLARITIN) 10 MG tablet Take 10 mg by mouth daily.    Marland Kitchen LORazepam (ATIVAN) 0.5 MG tablet TAKE 1 TABLET BY MOUTH  EVERY 8 HOURS 60 tablet 4  . montelukast (SINGULAIR) 10 MG tablet Take 1 tablet (10 mg total) by mouth at bedtime. 90 tablet 1  . terconazole (TERAZOL 7) 0.4 % vaginal cream Place 1 applicator vaginally at bedtime. 45 g 0  . traZODone (DESYREL) 50 MG tablet TAKE 1/2 TO 1 TABLET BY MOUTH EVERY NIGHT AT BEDTIME AS NEEDED FOR SLEEP 90 tablet 1   No current facility-administered medications on file prior to visit.    No Known Allergies  Social History   Socioeconomic History  . Marital status:  Married    Spouse name: Not on file  . Number of children: Not on file  . Years of education: Not on file  . Highest education level: Not on file  Occupational History  . Not on file  Tobacco Use  . Smoking status: Never Smoker  . Smokeless tobacco: Never Used  Substance and Sexual Activity  . Alcohol use: Yes    Alcohol/week: 7.0 standard drinks    Types: 7 Standard drinks or equivalent per week  . Drug use: No  . Sexual activity: Yes    Birth control/protection: Inserts  Other Topics Concern  . Not on file  Social History Narrative  . Not on file   Social Determinants of Health   Financial Resource Strain:   . Difficulty of Paying Living Expenses:   Food Insecurity:   . Worried About Charity fundraiser in the Last Year:   . Arboriculturist in the Last Year:   Transportation Needs:   . Film/video editor (Medical):   Marland Kitchen Lack of Transportation (Non-Medical):   Physical Activity:   . Days of Exercise per Week:   . Minutes of Exercise per Session:   Stress:   . Feeling of Stress :   Social Connections:   . Frequency of  Communication with Friends and Family:   . Frequency of Social Gatherings with Friends and Family:   . Attends Religious Services:   . Active Member of Clubs or Organizations:   . Attends Archivist Meetings:   Marland Kitchen Marital Status:   Intimate Partner Violence:   . Fear of Current or Ex-Partner:   . Emotionally Abused:   Marland Kitchen Physically Abused:   . Sexually Abused:     Family History  Problem Relation Age of Onset  . Diabetes Mother   . Cancer Mother        skin ca  . Diabetes Father   . Diabetes Maternal Grandmother   . Diabetes Maternal Grandfather   . Diabetes Paternal Grandmother   . Diabetes Paternal Grandfather   . Celiac disease Sister     The following portions of the patient's history were reviewed and updated as appropriate: allergies, current medications, past family history, past medical history, past social history, past  surgical history and problem list.  Review of Systems  ROS negative except as noted above. Information obtained from patient.    Objective:   BP 99/77   Pulse 66   Ht 5\' 6"  (1.676 m)   Wt 204 lb 4.8 oz (92.7 kg)   LMP 10/04/2019   BMI 32.97 kg/m    CONSTITUTIONAL: Well-developed, well-nourished female in no acute distress.   PSYCHIATRIC: Normal mood and affect. Normal behavior. Normal judgment and thought content.  [place]: Alert and oriented to person, place, and time. Normal muscle tone coordination. No cranial nerve deficit noted.  HENT:  Normocephalic, atraumatic, External right and left ear normal.   EYES: Conjunctivae and EOM are normal. Pupils are equal and round.   NECK: Normal range of motion, supple, no masses.  Normal thyroid.   SKIN: Skin is warm and dry. No rash noted. Not diaphoretic. No erythema. No pallor.  CARDIOVASCULAR: Normal heart rate noted, regular rhythm, no murmur.  RESPIRATORY: Clear to auscultation bilaterally. Effort and breath sounds normal, no problems with respiration noted.  BREASTS: Symmetric in size. No masses, skin changes, nipple drainage, or lymphadenopathy.  ABDOMEN: Soft, normal bowel sounds, no distention noted.  No tenderness, rebound or guarding.   PELVIC:  External Genitalia: Normal  BUS: Normal  Vagina: Normal  Cervix: Normal  Uterus: Normal  Adnexa: Normal  MUSCULOSKELETAL: Normal range of motion. No tenderness.  No cyanosis, clubbing, or edema.  2+ distal pulses.  LYMPHATIC: No Axillary, Supraclavicular, or Inguinal Adenopathy.  Assessment:   Annual gynecologic examination 34 y.o.   Contraception: NuvaRing vaginal inserts   Obesity 1   Problem List Items Addressed This Visit    None    Visit Diagnoses    Encounter for well woman exam with routine gynecological exam    -  Primary   Encounter for surveillance of vaginal ring hormonal contraceptive device          Plan:   Pap: Not needed  Labs: Managed  by PCP   Routine preventative health maintenance measures emphasized: Exercise/Diet/Weight control, Tobacco Warnings, Alcohol/Substance use risks and Stress Management; see AVS  Rx Nuvaring, see orders  Reviewed red flag symptoms and when to call  Return to [place] for [employer] and PAP   Diona Fanti, CNM [facility], [facility] 10/23/19 4:22 PM

## 2019-10-23 NOTE — Patient Instructions (Signed)
Preventive Care 21-36 Years Old, Female Preventive care refers to visits with your health care provider and lifestyle choices that can promote health and wellness. This includes:  A yearly physical exam. This may also be called an annual well check.  Regular dental visits and eye exams.  Immunizations.  Screening for certain conditions.  Healthy lifestyle choices, such as eating a healthy diet, getting regular exercise, not using drugs or products that contain nicotine and tobacco, and limiting alcohol use. What can I expect for my preventive care visit? Physical exam Your health care provider will check your:  Height and weight. This may be used to calculate body mass index (BMI), which tells if you are at a healthy weight.  Heart rate and blood pressure.  Skin for abnormal spots. Counseling Your health care provider may ask you questions about your:  Alcohol, tobacco, and drug use.  Emotional well-being.  Home and relationship well-being.  Sexual activity.  Eating habits.  Work and work environment.  Method of birth control.  Menstrual cycle.  Pregnancy history. What immunizations do I need?  Influenza (flu) vaccine  This is recommended every year. Tetanus, diphtheria, and pertussis (Tdap) vaccine  You may need a Td booster every 10 years. Varicella (chickenpox) vaccine  You may need this if you have not been vaccinated. Human papillomavirus (HPV) vaccine  If recommended by your health care provider, you may need three doses over 6 months. Measles, mumps, and rubella (MMR) vaccine  You may need at least one dose of MMR. You may also need a second dose. Meningococcal conjugate (MenACWY) vaccine  One dose is recommended if you are age 19-21 years and a first-year college student living in a residence hall, or if you have one of several medical conditions. You may also need additional booster doses. Pneumococcal conjugate (PCV13) vaccine  You may need  this if you have certain conditions and were not previously vaccinated. Pneumococcal polysaccharide (PPSV23) vaccine  You may need one or two doses if you smoke cigarettes or if you have certain conditions. Hepatitis A vaccine  You may need this if you have certain conditions or if you travel or work in places where you may be exposed to hepatitis A. Hepatitis B vaccine  You may need this if you have certain conditions or if you travel or work in places where you may be exposed to hepatitis B. Haemophilus influenzae type b (Hib) vaccine  You may need this if you have certain conditions. You may receive vaccines as individual doses or as more than one vaccine together in one shot (combination vaccines). Talk with your health care provider about the risks and benefits of combination vaccines. What tests do I need?  Blood tests  Lipid and cholesterol levels. These may be checked every 5 years starting at age 20.  Hepatitis C test.  Hepatitis B test. Screening  Diabetes screening. This is done by checking your blood sugar (glucose) after you have not eaten for a while (fasting).  Sexually transmitted disease (STD) testing.  BRCA-related cancer screening. This may be done if you have a family history of breast, ovarian, tubal, or peritoneal cancers.  Pelvic exam and Pap test. This may be done every 3 years starting at age 21. Starting at age 30, this may be done every 5 years if you have a Pap test in combination with an HPV test. Talk with your health care provider about your test results, treatment options, and if necessary, the need for more tests.   Follow these instructions at home: Eating and drinking   Eat a diet that includes fresh fruits and vegetables, whole grains, lean protein, and low-fat dairy.  Take vitamin and mineral supplements as recommended by your health care provider.  Do not drink alcohol if: ? Your health care provider tells you not to drink. ? You are  pregnant, may be pregnant, or are planning to become pregnant.  If you drink alcohol: ? Limit how much you have to 0-1 drink a day. ? Be aware of how much alcohol is in your drink. In the [place] one drink equals one 12 oz bottle of beer (355 mL), one 5 oz glass of wine (148 mL), or one 1 oz glass of hard liquor (44 mL). Lifestyle  Take daily care of your teeth and gums.  Stay active. Exercise for at least 30 minutes on 5 or more days each week.  Do not use any products that contain nicotine or tobacco, such as cigarettes, e-cigarettes, and chewing tobacco. If you need help quitting, ask your health care provider.  If you are sexually active, practice safe sex. Use a condom or other form of birth control (contraception) in order to prevent pregnancy and STIs (sexually transmitted infections). If you plan to become pregnant, see your health care provider for a preconception visit. What's next?  Visit your health care provider once a year for a well check visit.  Ask your health care provider how often you should have your eyes and teeth checked.  Stay up to date on all vaccines. This information is not intended to replace advice given to you by your health care provider. Make sure you discuss any questions you have with your health care provider. Document Revised: 03/31/2018 Document Reviewed: 03/31/2018 Elsevier Patient Education  2020 Elsevier Inc. Breast Self-Awareness Breast self-awareness is knowing how your breasts look and feel. Doing breast self-awareness is important. It allows you to catch a breast problem early while it is still small and can be treated. All women should do breast self-awareness, including women who have had breast implants. Tell your doctor if you notice a change in your breasts. What you need:  A mirror.  A well-lit room. How to do a breast self-exam A breast self-exam is one way to learn what is normal for your breasts and to check for changes. To do a  breast self-exam: Look for changes  1. Take off all the clothes above your waist. 2. Stand in front of a mirror in a room with good lighting. 3. Put your hands on your hips. 4. Push your hands down. 5. Look at your breasts and nipples in the mirror to see if one breast or nipple looks different from the other. Check to see if: ? The shape of one breast is different. ? The size of one breast is different. ? There are wrinkles, dips, and bumps in one breast and not the other. 6. Look at each breast for changes in the skin, such as: ? Redness. ? Scaly areas. 7. Look for changes in your nipples, such as: ? Liquid around the nipples. ? Bleeding. ? Dimpling. ? Redness. ? A change in where the nipples are. Feel for changes  1. Lie on your back on the floor. 2. Feel each breast. To do this, follow these steps: ? Pick a breast to feel. ? Put the arm closest to that breast above your head. ? Use your other arm to feel the nipple area of your breast. Feel   the area with the pads of your three middle fingers by making small circles with your fingers. For the first circle, press lightly. For the second circle, press harder. For the third circle, press even harder. ? Keep making circles with your fingers at the different pressures as you move down your breast. Stop when you feel your ribs. ? Move your fingers a little toward the center of your body. ? Start making circles with your fingers again, this time going up until you reach your collarbone. ? Keep making up-and-down circles until you reach your armpit. Remember to keep using the three pressures. ? Feel the other breast in the same way. 3. Sit or stand in the tub or shower. 4. With soapy water on your skin, feel each breast the same way you did in step 2 when you were lying on the floor. Write down what you find Writing down what you find can help you remember what to tell your doctor. Write down:  What is normal for each breast.  Any  changes you find in each breast, including: ? The kind of changes you find. ? Whether you have pain. ? Size and location of any lumps.  When you last had your menstrual period. General tips  Check your breasts every month.  If you are breastfeeding, the best time to check your breasts is after you feed your baby or after you use a breast pump.  If you get menstrual periods, the best time to check your breasts is 5-7 days after your menstrual period is over.  With time, you will become comfortable with the self-exam, and you will begin to know if there are changes in your breasts. Contact a doctor if you:  See a change in the shape or size of your breasts or nipples.  See a change in the skin of your breast or nipples, such as red or scaly skin.  Have fluid coming from your nipples that is not normal.  Find a lump or thick area that was not there before.  Have pain in your breasts.  Have any concerns about your breast health. Summary  Breast self-awareness includes looking for changes in your breasts, as well as feeling for changes within your breasts.  Breast self-awareness should be done in front of a mirror in a well-lit room.  You should check your breasts every month. If you get menstrual periods, the best time to check your breasts is 5-7 days after your menstrual period is over.  Let your doctor know of any changes you see in your breasts, including changes in size, changes on the skin, pain or tenderness, or fluid from your nipples that is not normal. This information is not intended to replace advice given to you by your health care provider. Make sure you discuss any questions you have with your health care provider. Document Revised: 03/08/2018 Document Reviewed: 03/08/2018 Elsevier Patient Education  [place]. Ethinyl Estradiol; Etonogestrel vaginal ring What is this medicine? ETHINYL ESTRADIOL; ETONOGESTREL (ETH in il es tra DYE ole; et oh noe JES trel)  vaginal ring is a flexible, vaginal ring used as a contraceptive (birth control method). This medicine combines 2 types of female hormones, an estrogen and a progestin. This ring is used to prevent ovulation and pregnancy. Each ring is effective for 1 month. This medicine may be used for other purposes; ask your health care provider or pharmacist if you have questions. COMMON BRAND NAME(S): EluRyng, NuvaRing What should I tell  my health care provider before I take this medicine? They need to know if you have any of these conditions: abnormal vaginal bleeding blood vessel disease or blood clots breast, cervical, endometrial, ovarian, liver, or uterine cancer diabetes gallbladder disease having surgery heart disease or recent heart attack high blood pressure high cholesterol or triglycerides history of irregular heartbeat or heart valve problems kidney disease liver disease migraine headaches protein C deficiency protein S deficiency recently had a baby, miscarriage, or abortion stroke systemic lupus erythematosus (SLE) tobacco smoker your age is more than 36 years old an unusual or allergic reaction to estrogens, progestins, other medicines, foods, dyes, or preservatives pregnant or trying to get pregnant breast-feeding How should I use this medicine? Insert the ring into your vagina as directed. Follow the directions on the prescription label. The ring will remain place for 3 weeks and is then removed for a 1-week break. A new ring is inserted 1 week after the last ring was removed, on the same day of the week. Check often to make sure the ring is still in place. If the ring was out of the vagina for an unknown amount of time, you may not be protected from pregnancy. Perform a pregnancy test and call your doctor. Do not use more often than directed. A patient package insert for the product will be given with each prescription and refill. Read this sheet carefully each time. The sheet  may change frequently. Contact your pediatrician regarding the use of this medicine in children. Special care may be needed. Overdosage: If you think you have taken too much of this medicine contact a poison control center or emergency room at once. NOTE: This medicine is only for you. Do not share this medicine with others. What if I miss a dose? You will need to use the ring exactly as directed. It is very important to follow the schedule every cycle. If you do not use the ring as directed, you may not be protected from pregnancy. If the ring should slip out, is lost, or if you leave it in longer or shorter than you should, contact your health care professional for advice. What may interact with this medicine? Do not take this medicine with the following medications: dasabuvir; ombitasvir; paritaprevir; ritonavir ombitasvir; paritaprevir; ritonavir vaginal lubricants or other vaginal products that are oil-based or silicone-based This medicine may also interact with the following medications: acetaminophen antibiotics or medicines for infections, especially rifampin, rifabutin, rifapentine, and griseofulvin, and possibly penicillins or tetracyclines aprepitant or fosaprepitant armodafinil ascorbic acid (vitamin C) barbiturate medicines, such as phenobarbital or primidone bosentan certain antiviral medicines for hepatitis, HIV or AIDS certain medicines for cancer treatment certain medicines for seizures like carbamazepine, clobazam, felbamate, lamotrigine, oxcarbazepine, phenytoin, rufinamide, topiramate certain medicines for treating high cholesterol cyclosporine dantrolene elagolix flibanserin grapefruit juice lesinurad medicines for diabetes medicines to treat fungal infections, such as griseofulvin, miconazole, fluconazole, ketoconazole, itraconazole, posaconazole or voriconazole mifepristone mitotane modafinil morphine mycophenolate [facility]  wort tamoxifen temazepam theophylline or aminophylline thyroid hormones tizanidine tranexamic acid ulipristal warfarin This list may not describe all possible interactions. Give your health care provider a list of all the medicines, herbs, non-prescription drugs, or dietary supplements you use. Also tell them if you smoke, drink alcohol, or use illegal drugs. Some items may interact with your medicine. What should I watch for while using this medicine? Visit your doctor or health care professional for regular checks on your progress. You will need a regular breast and pelvic exam  and Pap smear while on this medicine. Check with your doctor or health care professional to see if you need an additional method of contraception during the first cycle that you use this ring. Female condoms (made with natural rubber latex, polyisoprene, and polyurethane) and spermicides may be used. Do not use a diaphragm, cervical cap, or a female condom, as the ring can interfere with these birth control methods and their proper placement. If you have any reason to think you are pregnant, stop using this medicine right away and contact your doctor or health care professional. If you are using this medicine for hormone related problems, it may take several cycles of use to see improvement in your condition. Smoking increases the risk of getting a blood clot or having a stroke while you are using hormonal birth control, especially if you are more than 36 years old. You are strongly advised not to smoke. Some women are prone to getting dark patches on the skin of the face (cholasma). Your risk of getting chloasma with this medicine is higher if you had chloasma during a pregnancy. Keep out of the sun. If you cannot avoid being in the sun, wear protective clothing and use sunscreen. Do not use sun lamps or tanning beds/booths. This medicine can make your body retain fluid, making your fingers, hands, or ankles swell. Your  blood pressure can go up. Contact your doctor or health care professional if you feel you are retaining fluid. If you are going to have elective surgery, you may need to stop using this medicine before the surgery. Consult your health care professional for advice. This medicine does not protect you against HIV infection (AIDS) or any other sexually transmitted diseases. What side effects may I notice from receiving this medicine? Side effects that you should report to your doctor or health care professional as soon as possible: allergic reactions such as skin rash or itching, hives, swelling of the lips, mouth, tongue, or throat depression high blood pressure migraines or severe, sudden headaches signs and symptoms of a blood clot such as breathing problems; changes in vision; chest pain; severe, sudden headache; pain, swelling, warmth in the leg; trouble speaking; sudden numbness or weakness of the face, arm or leg signs and symptoms of infection like fever or chills with dizziness and a sunburn-like rash, or pain or trouble passing urine stomach pain symptoms of vaginal infection like itching, irritation or unusual discharge yellowing of the eyes or skin Side effects that usually do not require medical attention (report these to your doctor or health care professional if they continue or are bothersome): acne breast pain, tenderness irregular vaginal bleeding or spotting, particularly during the first month of use mild headache nausea painful periods vomiting This list may not describe all possible side effects. Call your doctor for medical advice about side effects. You may report side effects to FDA at 1-800-FDA-1088. Where should I keep my medicine? Keep out of the reach of children. Store unopened medicine for up to 4 months at room temperature at 15 and 30 degrees C (59 and 86 degrees F). Protect from light. Do not store above 30 degrees C (86 degrees F). Throw away any unused  medicine 4 months after the dispense date or the expiration date, whichever comes first. A ring may only be used for 1 cycle (1 month). After the 3-week cycle, a used ring is removed and should be placed in the re-closable foil pouch and discarded in the trash out of reach  of children and pets. Do NOT flush down the toilet. NOTE: This sheet is a summary. It may not cover all possible information. If you have questions about this medicine, talk to your doctor, pharmacist, or health care provider.  2020 Elsevier/Gold Standard (2019-02-09 12:31:47)

## 2019-10-31 ENCOUNTER — Other Ambulatory Visit: Payer: Self-pay

## 2019-10-31 ENCOUNTER — Telehealth (INDEPENDENT_AMBULATORY_CARE_PROVIDER_SITE_OTHER): Payer: Managed Care, Other (non HMO) | Admitting: Family Medicine

## 2019-10-31 ENCOUNTER — Encounter: Payer: Self-pay | Admitting: Family Medicine

## 2019-10-31 DIAGNOSIS — L237 Allergic contact dermatitis due to plants, except food: Secondary | ICD-10-CM | POA: Diagnosis not present

## 2019-10-31 MED ORDER — ETONOGESTREL-ETHINYL ESTRADIOL 0.12-0.015 MG/24HR VA RING: VAGINAL_RING | VAGINAL | 4 refills | Status: DC

## 2019-10-31 MED ORDER — CLOBETASOL PROPIONATE 0.05 % EX OINT: 1.0000 "application " | TOPICAL_OINTMENT | Freq: Two times a day (BID) | CUTANEOUS | 0 refills | Status: DC

## 2019-10-31 NOTE — Progress Notes (Signed)
Virtual Visit via video Note  This visit type was conducted due to national recommendations for restrictions regarding the COVID-19 pandemic (e.g. social distancing).  This format is felt to be most appropriate for this patient at this time.  All issues noted in this document were discussed and addressed.  No physical exam was performed (except for noted visual exam findings with Video Visits).   I connected with Kendra Ramos today at 11:00 AM EDT by a video enabled telemedicine application and verified that I am speaking with the correct person using two identifiers. Location patient: home Location provider: work  Persons participating in the virtual visit: patient, provider  I discussed the limitations, risks, security and privacy concerns of performing an evaluation and management service by telephone and the availability of in person appointments. I also discussed with the patient that there may be a patient responsible charge related to this service. The patient expressed understanding and agreed to proceed.  Reason for visit: same day visit  HPI: Rash: Patient notes that started a week ago.  She was cleaning out briars around the fence and believes she got into poison ivy.  She had some rash for about a week though over the last couple of days it has started to itch significantly.  Its linear papulovesicular rash on bilateral arms.  She tried some topical over-the-counter medications with no benefit.  No rash elsewhere on her body.   ROS: See pertinent positives and negatives per HPI.  Past Medical History:  Diagnosis Date  . Anxiety   . Contraception   . Headache   . Vaginal Pap smear, abnormal     No past surgical history on file.  Family History  Problem Relation Age of Onset  . Diabetes Mother   . Cancer Mother        skin ca  . Diabetes Father   . Diabetes Maternal Grandmother   . Diabetes Maternal Grandfather   . Diabetes Paternal Grandmother   . Diabetes  Paternal Grandfather   . Celiac disease Sister     SOCIAL HX: Non-smoker   Current Outpatient Medications:  .  albuterol (PROAIR HFA) 108 (90 Base) MCG/ACT inhaler, Inhale 2 puffs into the lungs every 6 (six) hours as needed for wheezing or shortness of breath., Disp: 6.7 g, Rfl: 2 .  budesonide-formoterol (SYMBICORT) 80-4.5 MCG/ACT inhaler, Inhale 2 puffs into the lungs 2 (two) times daily., Disp: 1 Inhaler, Rfl: 1 .  etonogestrel-ethinyl estradiol (NUVARING) 0.12-0.015 MG/24HR vaginal ring, Insert vaginally and leave in place for 3 consecutive weeks, then remove for 1 week., Disp: 3 each, Rfl: 4 .  loratadine (CLARITIN) 10 MG tablet, Take 10 mg by mouth daily., Disp: , Rfl:  .  LORazepam (ATIVAN) 0.5 MG tablet, TAKE 1 TABLET BY MOUTH  EVERY 8 HOURS, Disp: 60 tablet, Rfl: 4 .  montelukast (SINGULAIR) 10 MG tablet, Take 1 tablet (10 mg total) by mouth at bedtime., Disp: 90 tablet, Rfl: 1 .  terconazole (TERAZOL 7) 0.4 % vaginal cream, Place 1 applicator vaginally at bedtime., Disp: 45 g, Rfl: 0 .  traZODone (DESYREL) 50 MG tablet, TAKE 1/2 TO 1 TABLET BY MOUTH EVERY NIGHT AT BEDTIME AS NEEDED FOR SLEEP, Disp: 90 tablet, Rfl: 1 .  clobetasol ointment (TEMOVATE) [id number] %, Apply 1 application topically 2 (two) times daily. Until improved. Only use for 7 days maximum duration., Disp: 15 g, Rfl: 0  EXAM:  VITALS per patient if applicable:  GENERAL: alert, oriented, appears well and in  no acute distress  HEENT: atraumatic, conjunttiva clear, no obvious abnormalities on inspection of external nose and ears  NECK: normal movements of the head and neck  LUNGS: on inspection no signs of respiratory distress, breathing rate appears normal, no obvious gross SOB, gasping or wheezing  CV: no obvious cyanosis  MS: moves all visible extremities without noticeable abnormality  Skin: Scattered linear papulovesicular rash on bilateral arms  ASSESSMENT AND PLAN:  Discussed the following assessment  and plan:  Poison ivy dermatitis Rash is consistent with a poison ivy dermatitis.  Will treat with topical steroids to start just to be applied to the area of the rash.  If not improving in the next few days to 7 days she will let us know and we can consider oral steroids.  Discussed the risk of skin atrophy and depigmentation with topical steroids.   No orders of the defined types were placed in this encounter.   Meds ordered this encounter  Medications  . clobetasol ointment (TEMOVATE) 0.05 %    Sig: Apply 1 application topically 2 (two) times daily. Until improved. Only use for 7 days maximum duration.    Dispense:  15 g    Refill:  0     I discussed the assessment and treatment plan with the patient. The patient was provided an opportunity to ask questions and all were answered. The patient agreed with the plan and demonstrated an understanding of the instructions.   The patient was advised to call back or seek an in-person evaluation if the symptoms worsen or if the condition fails to improve as anticipated.    Tommi Rumps, MD

## 2019-10-31 NOTE — Telephone Encounter (Signed)
NuvaRing sent to [facility] per patient request. Mychart message sent to patient.

## 2019-10-31 NOTE — Assessment & Plan Note (Signed)
Rash is consistent with a poison ivy dermatitis.  Will treat with topical steroids to start just to be applied to the area of the rash.  If not improving in the next few days to 7 days she will let us know and we can consider oral steroids.  Discussed the risk of skin atrophy and depigmentation with topical steroids.

## 2019-11-20 MED ORDER — MONTELUKAST SODIUM 10 MG PO TABS: 10.0000 mg | ORAL_TABLET | Freq: Every day | ORAL | 1 refills | Status: DC

## 2020-02-07 IMAGING — DX DG CHEST 2V
2 series · 2 of 2 positions shown · non-contrast
Comparison: PA and lateral chest 03/07/2019.

CLINICAL DATA: History of right upper lobe pneumonia.

EXAM:
CHEST - 2 VIEW

[chest pa]
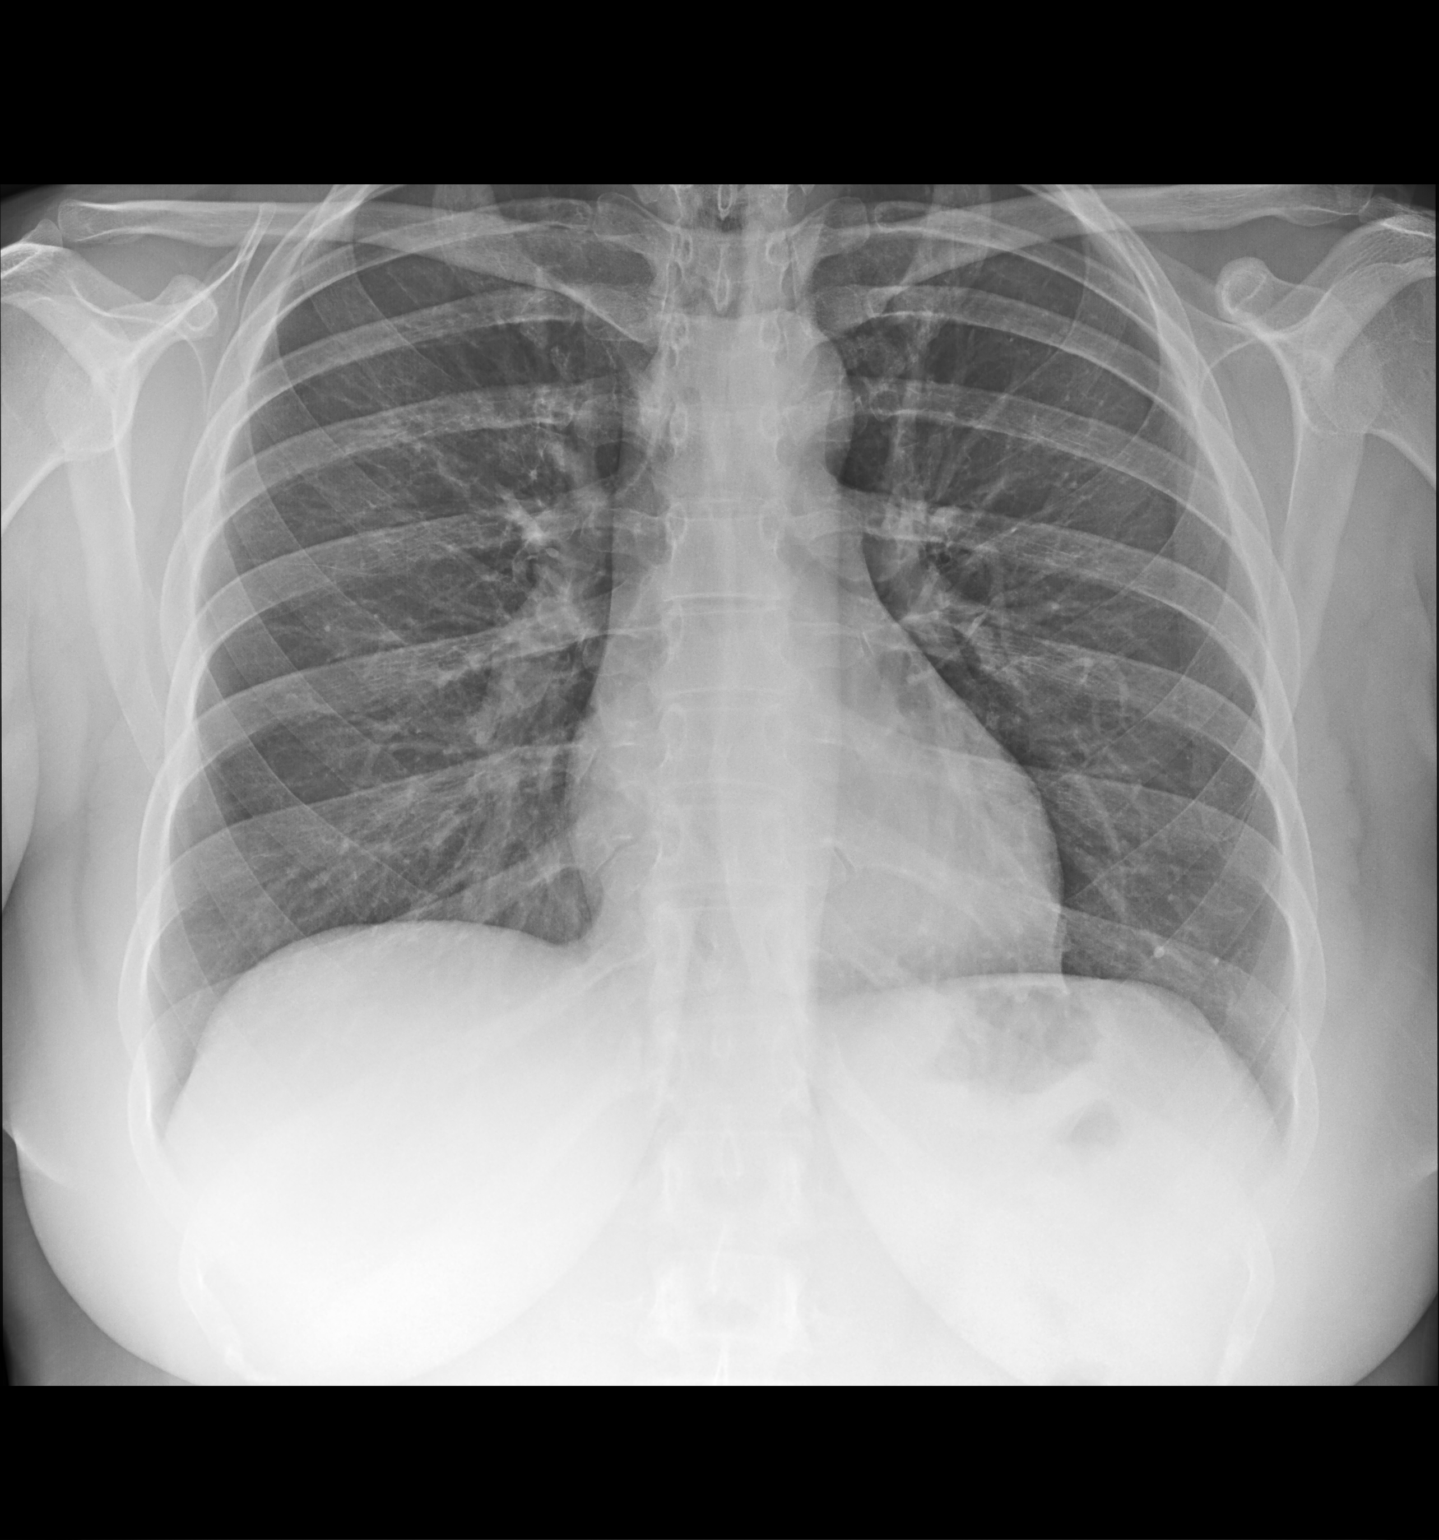

[chest lat]
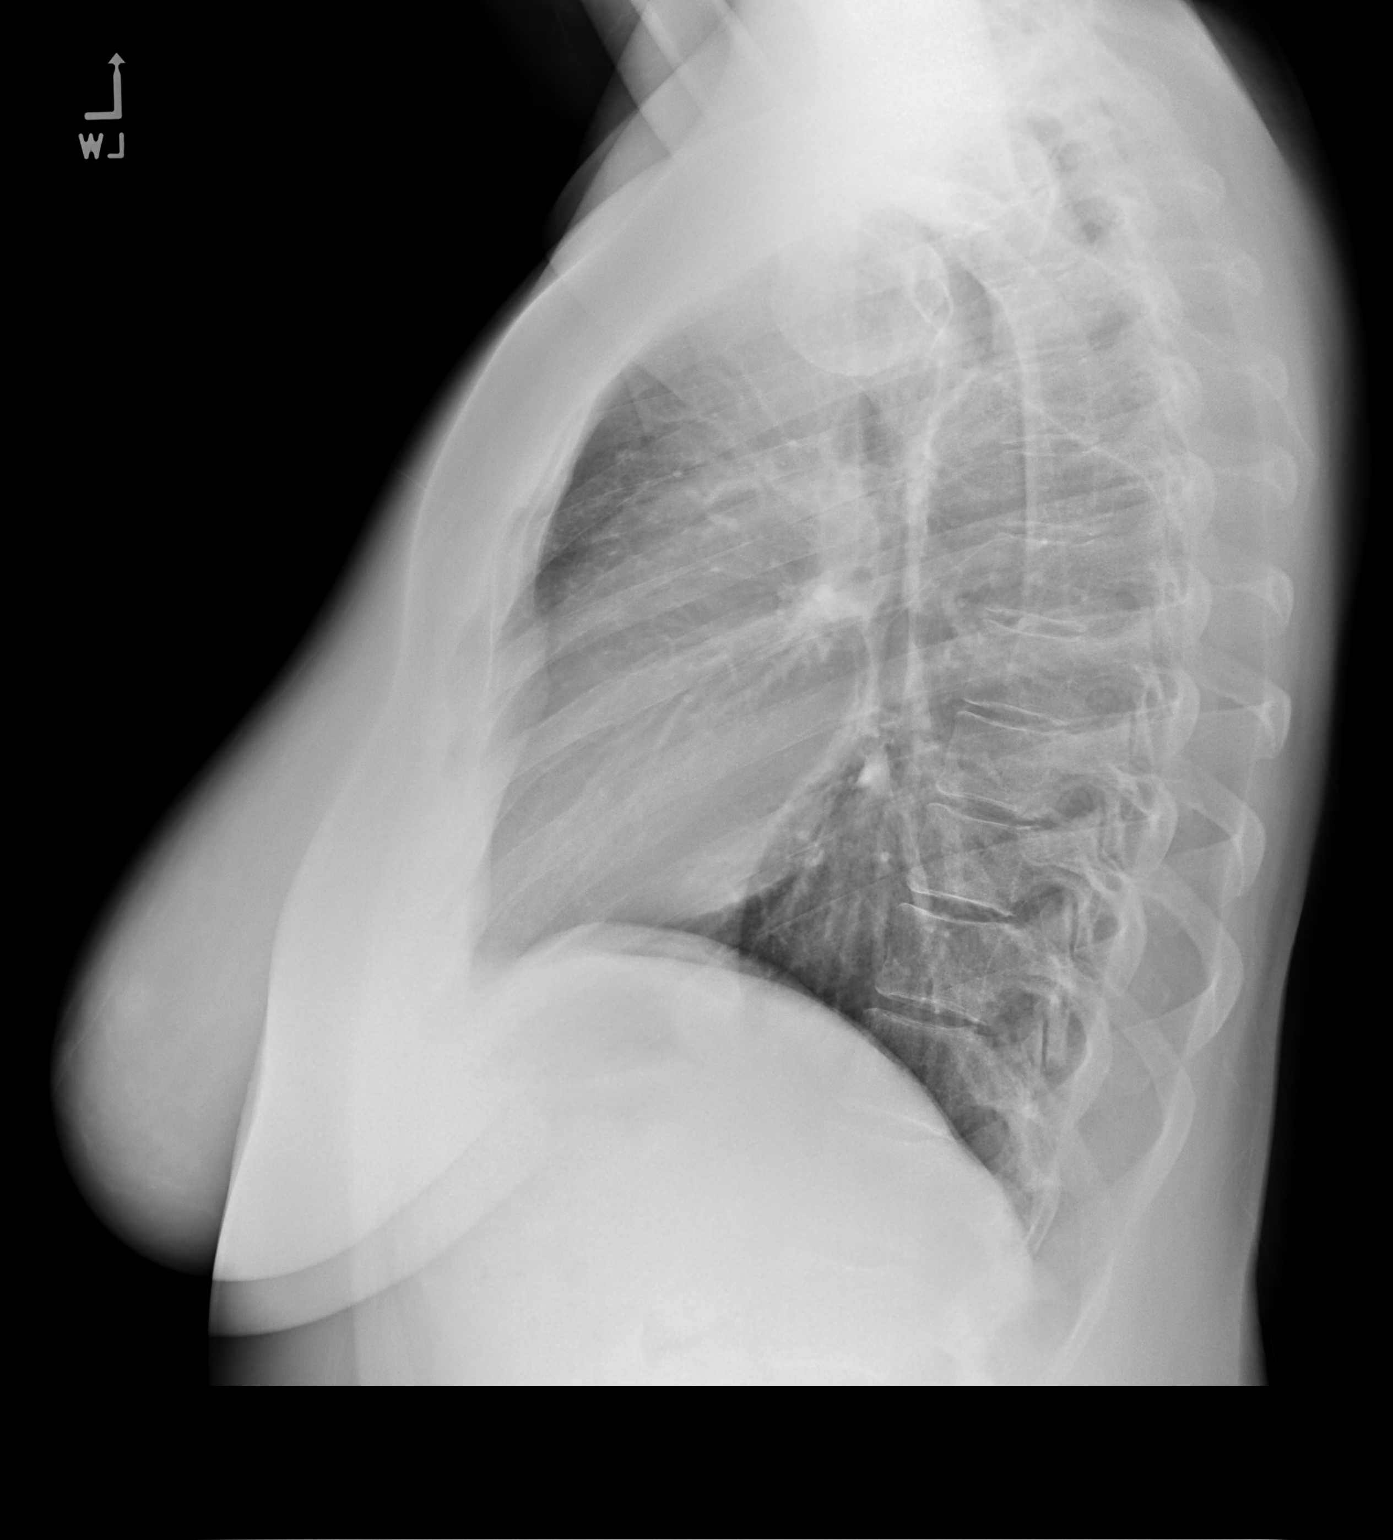

[2 of 2 positions shown; findings below may reference images not displayed]

FINDINGS: Right upper lobe pneumonia has resolved. The lungs are clear. No
pneumothorax or pleural effusion. Heart size is normal. No acute or
focal bony abnormality.
IMPRESSION: Resolved right upper lobe pneumonia.  No acute abnormality.

## 2020-02-14 ENCOUNTER — Other Ambulatory Visit: Payer: Self-pay | Admitting: Internal Medicine

## 2020-02-14 DIAGNOSIS — G4700 Insomnia, unspecified: Secondary | ICD-10-CM

## 2020-03-04 ENCOUNTER — Other Ambulatory Visit: Payer: Self-pay

## 2020-03-04 ENCOUNTER — Telehealth (INDEPENDENT_AMBULATORY_CARE_PROVIDER_SITE_OTHER): Payer: Managed Care, Other (non HMO) | Admitting: Nurse Practitioner

## 2020-03-04 ENCOUNTER — Encounter: Payer: Self-pay | Admitting: Nurse Practitioner

## 2020-03-04 VITALS — Temp 97.6°F | Ht 66.0 in | Wt 191.0 lb

## 2020-03-04 DIAGNOSIS — J019 Acute sinusitis, unspecified: Secondary | ICD-10-CM

## 2020-03-04 DIAGNOSIS — J069 Acute upper respiratory infection, unspecified: Secondary | ICD-10-CM | POA: Diagnosis not present

## 2020-03-04 MED ORDER — AMOXICILLIN-POT CLAVULANATE 875-125 MG PO TABS: 1.0000 | ORAL_TABLET | Freq: Two times a day (BID) | ORAL | 0 refills | Status: AC

## 2020-03-04 MED ORDER — FLUTICASONE PROPIONATE 50 MCG/ACT NA SUSP: 2.0000 | Freq: Every day | NASAL | 6 refills | Status: DC

## 2020-03-04 MED ORDER — SACCHAROMYCES BOULARDII 250 MG PO CAPS: 250.0000 mg | ORAL_CAPSULE | Freq: Two times a day (BID) | ORAL | 0 refills | Status: AC

## 2020-03-04 NOTE — Progress Notes (Signed)
Virtual Visit via Virtual  Note  This visit type was conducted due to national recommendations for restrictions regarding the COVID-19 pandemic (e.g. social distancing).  This format is felt to be most appropriate for this patient at this time.  All issues noted in this document were discussed and addressed.  No physical exam was performed (except for noted visual exam findings with Video Visits).   I connected with@ on 03/07/20 at  3:30 PM EDT by a video enabled telemedicine application or telephone and verified that I am speaking with the correct person using two identifiers. Location patient: home Location provider: work or home office Persons participating in the virtual visit: patient, provider  I discussed the limitations, risks, security and privacy concerns of performing an evaluation and management service by telephone and the availability of in person appointments. I also discussed with the patient that there may be a patient responsible charge related to this service. The patient expressed understanding and agreed to proceed.   Reason for visit: Sinus issues with cough sneezing x2 to 3 weeks.  HPI: This 34 year old patient with history of asthma maintained on Symbicort and albuterol just had visit with her pulmonologist on 01/10/2020 was doing well. His OV note reviewed.  She reports she is now 2 weeks into what she thought was an allergy flare with a runny nose, runny eyes, sneezing a lot, and very congested when she woke up in the morning.  She has been taking Mucinex every day for the last couple weeks.  She still has a lot of purulent nasal discharge, facial pressure - especially maxillary and all over her head. Voice is hoarse and she feels swimmy headed, fatigued. Patient feels like this is a sinus infection that has not gotten into her lungs, yet.  Minimal cough, no wheezing and she has been using her Symbicort twice daily but has not needed to use her albuterol rescue inhaler. But  she is afraid that it well.  She has had temperatures of 98.1, no chills, no ear pain, but she wakes up with headaches and has maxillary tenderness and pressure. She has not had the Covid vaccine and states she will never get it.  She has had no ill contacts.  ROS: See pertinent positives and negatives per HPI.  Past Medical History:  Diagnosis Date  . Anxiety   . Contraception   . Headache   . Vaginal Pap smear, abnormal     No past surgical history on file.  Family History  Problem Relation Age of Onset  . Diabetes Mother   . Cancer Mother        skin ca  . Diabetes Father   . Diabetes Maternal Grandmother   . Diabetes Maternal Grandfather   . Diabetes Paternal Grandmother   . Diabetes Paternal Grandfather   . Celiac disease Sister     SOCIAL HX: non smoker   Current Outpatient Medications:  .  albuterol (PROAIR HFA) 108 (90 Base) MCG/ACT inhaler, Inhale 2 puffs into the lungs every 6 (six) hours as needed for wheezing or shortness of breath., Disp: 6.7 g, Rfl: 2 .  budesonide-formoterol (SYMBICORT) 80-4.5 MCG/ACT inhaler, Inhale 2 puffs into the lungs 2 (two) times daily., Disp: 1 Inhaler, Rfl: 1 .  etonogestrel-ethinyl estradiol (NUVARING) 0.12-0.015 MG/24HR vaginal ring, Insert vaginally and leave in place for 3 consecutive weeks, then remove for 1 week., Disp: 3 each, Rfl: 4 .  loratadine (CLARITIN) 10 MG tablet, Take 10 mg by mouth daily., Disp: , Rfl:  .  LORazepam (ATIVAN) 0.5 MG tablet, TAKE 1 TABLET BY MOUTH  EVERY 8 HOURS, Disp: 60 tablet, Rfl: 4 .  montelukast (SINGULAIR) 10 MG tablet, Take 1 tablet (10 mg total) by mouth at bedtime., Disp: 90 tablet, Rfl: 1 .  terconazole (TERAZOL 7) 0.4 % vaginal cream, Place 1 applicator vaginally at bedtime., Disp: 45 g, Rfl: 0 .  traZODone (DESYREL) 50 MG tablet, TAKE 1/2 TO 1 TABLET BY MOUTH EVERY NIGHT AT BEDTIME AS NEEDED FOR SLEEP, Disp: 90 tablet, Rfl: 1 .  amoxicillin-clavulanate (AUGMENTIN) 875-125 MG tablet, Take 1  tablet by mouth 2 (two) times daily for 7 days., Disp: 14 tablet, Rfl: 0 .  fluticasone (FLONASE) 50 MCG/ACT nasal spray, Place 2 sprays into both nostrils daily., Disp: 16 g, Rfl: 6 .  saccharomyces boulardii (FLORASTOR) 250 MG capsule, Take 1 capsule (250 mg total) by mouth 2 (two) times daily for 7 days., Disp: 14 capsule, Rfl: 0  EXAM:  VITALS per patient if applicable:  GENERAL: alert, oriented, appears mildly fatigued, and  in no acute distress  HEENT: atraumatic, conjunctiva clear, no obvious abnormalities on inspection of external nose and ears, congestion noted, hoarseness  NECK: normal movements of the head and neck  LUNGS: on inspection no signs of respiratory distress, breathing rate appears normal, no obvious gross SOB, gasping or wheezing  CV: no obvious cyanosis  MS: moves all visible extremities without noticeable abnormality  PSYCH/NEURO: pleasant and cooperative, no obvious depression or anxiety, speech and thought processing grossly intact  ASSESSMENT AND PLAN:  Discussed the following assessment and plan:  Upper respiratory tract infection, unspecified type  Acute non-recurrent sinusitis, unspecified location  No problem-specific Assessment & Plan notes found for this encounter. 2-week illness with upper respiratory symptoms, maxillary pressure, nasal congestion, fatigue and headache.  She has a history of asthma and this does not seem to be in the chest.  She is using her usual asthma medications and has not had to use her albuterol.  She is unvaccinated for Covid. She has been around no ill contacts.   Please get a Covid test as soon as she can.  [facility]  Covid sites- please call [phone] . Or, you  can also go to the pharmacy where you pick up your  medications today and get tested if that is more convenient.  If she test positive we need to know about it as we will make further recommendations.  Since you are sick right now, you should avoid going out of the  house except for medical care and always wear a mask.  You should not have people into the house that do not live there. These are quarantine precautions until you get a negative Covid test result.  I have ordered Augmentin 875 mg twice daily.  Please take with food as this can irritate the stomach. I have also ordered Florastor which is a probiotic to take to help prevent C. difficile diarrhea. I have refilled your Flonase nasal spray and you may use that if it helps. You can use Simply Saline nasal spray to help moisturize and to flush the nose, and will act as a mild decongestant.  I discussed the assessment and treatment plan with the patient. The patient was provided an opportunity to ask questions and all were answered. The patient agreed with the plan and demonstrated an understanding of the instructions.   The patient was advised to call back or seek an in-person evaluation if the symptoms worsen or if the condition  fails to improve as anticipated.   Denice Paradise, NP Adult Nurse Practitioner [place] [phone]

## 2020-03-07 ENCOUNTER — Encounter: Payer: Self-pay | Admitting: Nurse Practitioner

## 2020-03-07 DIAGNOSIS — J069 Acute upper respiratory infection, unspecified: Secondary | ICD-10-CM | POA: Insufficient documentation

## 2020-03-07 NOTE — Patient Instructions (Addendum)
Please get a Covid test as soon as she can.  [facility]  Covid sites- please call [phone] . Or, you  can also go to the pharmacy where you pick up your  medications today and get tested if that is more convenient.  If she test positive we need to know about it as we will make further recommendations.  Since you are sick right now, you should avoid going out of the house except for medical care and always wear a mask.  You should not have people into the house that do not live there. These are quarantine precautions until you get a negative Covid test result.  I have ordered Augmentin 875 mg twice daily.  Please take with food as this can irritate the stomach. I have also ordered Florastor which is a probiotic to take to help prevent C. difficile diarrhea. I have refilled your Flonase nasal spray and you may use that if it helps. You can use Simply Saline nasal spray to help moisturize and to flush the nose, and will act as a mild decongestant.  Contact a health care provider if:  You have a fever.  Your symptoms get worse.  Your symptoms do not improve within 3 days. Get help right away if:  You have a severe headache.  You have persistent vomiting.  You have severe pain or swelling around your face or eyes.  You have vision problems.  You develop confusion.  Your neck is stiff.  You have trouble breathing.  Sinusitis, Adult Sinusitis is inflammation of your sinuses. Sinuses are hollow spaces in the bones around your face. Your sinuses are located:  Around your eyes.  In the middle of your forehead.  Behind your nose.  In your cheekbones. Mucus normally drains out of your sinuses. When your nasal tissues become inflamed or swollen, mucus can become trapped or blocked. This allows bacteria, viruses, and fungi to grow, which leads to infection. Most infections of the sinuses are caused by a virus. Sinusitis can develop quickly. It can last for up to 4 weeks (acute) or for more  than 12 weeks (chronic). Sinusitis often develops after a cold. What are the causes? This condition is caused by anything that creates swelling in the sinuses or stops mucus from draining. This includes:  Allergies.  Asthma.  Infection from bacteria or viruses.  Deformities or blockages in your nose or sinuses.  Abnormal growths in the nose (nasal polyps).  Pollutants, such as chemicals or irritants in the air.  Infection from fungi (rare). What increases the risk? You are more likely to develop this condition if you:  Have a weak body defense system (immune system).  Do a lot of swimming or diving.  Overuse nasal sprays.  Smoke. What are the signs or symptoms? The main symptoms of this condition are pain and a feeling of pressure around the affected sinuses. Other symptoms include:  Stuffy nose or congestion.  Thick drainage from your nose.  Swelling and warmth over the affected sinuses.  Headache.  Upper toothache.  A cough that may get worse at night.  Extra mucus that collects in the throat or the back of the nose (postnasal drip).  Decreased sense of smell and taste.  Fatigue.  A fever.  Sore throat.  Bad breath. How is this diagnosed? This condition is diagnosed based on:  Your symptoms.  Your medical history.  A physical exam.  Tests to find out if your condition is acute or chronic. This may  include: ? Checking your nose for nasal polyps. ? Viewing your sinuses using a device that has a light (endoscope). ? Testing for allergies or bacteria. ? Imaging tests, such as an MRI or CT scan. In rare cases, a bone biopsy may be done to rule out more serious types of fungal sinus disease. How is this treated? Treatment for sinusitis depends on the cause and whether your condition is chronic or acute.  If caused by a virus, your symptoms should go away on their own within 10 days. You may be given medicines to relieve symptoms. They  include: ? Medicines that shrink swollen nasal passages (topical intranasal decongestants). ? Medicines that treat allergies (antihistamines). ? A spray that eases inflammation of the nostrils (topical intranasal corticosteroids). ? Rinses that help get rid of thick mucus in your nose (nasal saline washes).  If caused by bacteria, your health care provider may recommend waiting to see if your symptoms improve. Most bacterial infections will get better without antibiotic medicine. You may be given antibiotics if you have: ? A severe infection. ? A weak immune system.  If caused by narrow nasal passages or nasal polyps, you may need to have surgery. Follow these instructions at home: Medicines  Take, use, or apply over-the-counter and prescription medicines only as told by your health care provider. These may include nasal sprays.  If you were prescribed an antibiotic medicine, take it as told by your health care provider. Do not stop taking the antibiotic even if you start to feel better. Hydrate and humidify   Drink enough fluid to keep your urine pale yellow. Staying hydrated will help to thin your mucus.  Use a cool mist humidifier to keep the humidity level in your home above 50%.  Inhale steam for 10-15 minutes, 3-4 times a day, or as told by your health care provider. You can do this in the bathroom while a hot shower is running.  Limit your exposure to cool or dry air. Rest  Rest as much as possible.  Sleep with your head raised (elevated).  Make sure you get enough sleep each night. General instructions   Apply a warm, moist washcloth to your face 3-4 times a day or as told by your health care provider. This will help with discomfort.  Wash your hands often with soap and water to reduce your exposure to germs. If soap and water are not available, use hand sanitizer.  Do not smoke. Avoid being around people who are smoking (secondhand smoke).  Keep all follow-up  visits as told by your health care provider. This is important. Contact a health care provider if:  You have a fever.  Your symptoms get worse.  Your symptoms do not improve within 10 days. Get help right away if:  You have a severe headache.  You have persistent vomiting.  You have severe pain or swelling around your face or eyes.  You have vision problems.  You develop confusion.  Your neck is stiff.  You have trouble breathing. Summary  Sinusitis is soreness and inflammation of your sinuses. Sinuses are hollow spaces in the bones around your face.  This condition is caused by nasal tissues that become inflamed or swollen. The swelling traps or blocks the flow of mucus. This allows bacteria, viruses, and fungi to grow, which leads to infection.  If you were prescribed an antibiotic medicine, take it as told by your health care provider. Do not stop taking the antibiotic even if you start  to feel better.  Keep all follow-up visits as told by your health care provider. This is important. This information is not intended to replace advice given to you by your health care provider. Make sure you discuss any questions you have with your health care provider. Document Revised: 12/20/2017 Document Reviewed: 12/20/2017 Elsevier Patient Education  2020 [place].  Upper Respiratory Infection, Adult An upper respiratory infection (URI) is a common viral infection of the nose, throat, and upper air passages that lead to the lungs. The most common type of URI is the common cold. URIs usually get better on their own, without medical treatment. What are the causes? A URI is caused by a virus. You may catch a virus by:  Breathing in droplets from an infected person's cough or sneeze.  Touching something that has been exposed to the virus (contaminated) and then touching your mouth, nose, or eyes. What increases the risk? You are more likely to get a URI if:  You are very young  or very old.  It is autumn or winter.  You have close contact with others, such as at a daycare, school, or health care facility.  You smoke.  You have long-term (chronic) heart or lung disease.  You have a weakened disease-fighting (immune) system.  You have nasal allergies or asthma.  You are experiencing a lot of stress.  You work in an area that has poor air circulation.  You have poor nutrition. What are the signs or symptoms? A URI usually involves some of the following symptoms:  Runny or stuffy (congested) nose.  Sneezing.  Cough.  Sore throat.  Headache.  Fatigue.  Fever.  Loss of appetite.  Pain in your forehead, behind your eyes, and over your cheekbones (sinus pain).  Muscle aches.  Redness or irritation of the eyes.  Pressure in the ears or face. How is this diagnosed? This condition may be diagnosed based on your medical history and symptoms, and a physical exam. Your health care provider may use a cotton swab to take a mucus sample from your nose (nasal swab). This sample can be tested to determine what virus is causing the illness. How is this treated? URIs usually get better on their own within 7-10 days. You can take steps at home to relieve your symptoms. Medicines cannot cure URIs, but your health care provider may recommend certain medicines to help relieve symptoms, such as:  Over-the-counter cold medicines.  Cough suppressants. Coughing is a type of defense against infection that helps to clear the respiratory system, so take these medicines only as recommended by your health care provider.  Fever-reducing medicines. Follow these instructions at home: Activity  Rest as needed.  If you have a fever, stay home from work or school until your fever is gone or until your health care provider says you are no longer contagious. Your health care provider may have you wear a face mask to prevent your infection from spreading. Relieving  symptoms  Gargle with a salt-water mixture 3-4 times a day or as needed. To make a salt-water mixture, completely dissolve -1 tsp of salt in 1 cup of warm water.  Use a cool-mist humidifier to add moisture to the air. This can help you breathe more easily. Eating and drinking   Drink enough fluid to keep your urine pale yellow.  Eat soups and other clear broths. General instructions   Take over-the-counter and prescription medicines only as told by your health care provider. These include cold  medicines, fever reducers, and cough suppressants.  Do not use any products that contain nicotine or tobacco, such as cigarettes and e-cigarettes. If you need help quitting, ask your health care provider.  Stay away from secondhand smoke.  Stay up to date on all immunizations, including the yearly (annual) flu vaccine.  Keep all follow-up visits as told by your health care provider. This is important. How to prevent the spread of infection to others   URIs can be passed from person to person (are contagious). To prevent the infection from spreading: ? Wash your hands often with soap and water. If soap and water are not available, use hand sanitizer. ? Avoid touching your mouth, face, eyes, or nose. ? Cough or sneeze into a tissue or your sleeve or elbow instead of into your hand or into the air. Contact a health care provider if:  You are getting worse instead of better.  You have a fever or chills.  Your mucus is brown or red.  You have yellow or brown discharge coming from your nose.  You have pain in your face, especially when you bend forward.  You have swollen neck glands.  You have pain while swallowing.  You have white areas in the back of your throat. Get help right away if:  You have shortness of breath that gets worse.  You have severe or persistent: ? Headache. ? Ear pain. ? Sinus pain. ? Chest pain.  You have chronic lung disease along with any of the  following: ? Wheezing. ? Prolonged cough. ? Coughing up blood. ? A change in your usual mucus.  You have a stiff neck.  You have changes in your: ? Vision. ? Hearing. ? Thinking. ? Mood. Summary  An upper respiratory infection (URI) is a common infection of the nose, throat, and upper air passages that lead to the lungs.  A URI is caused by a virus.  URIs usually get better on their own within 7-10 days.  Medicines cannot cure URIs, but your health care provider may recommend certain medicines to help relieve symptoms. This information is not intended to replace advice given to you by your health care provider. Make sure you discuss any questions you have with your health care provider. Document Revised: 07/28/2018 Document Reviewed: 03/05/2017 Elsevier Patient Education  [place].

## 2020-03-09 ENCOUNTER — Other Ambulatory Visit: Payer: Self-pay | Admitting: Internal Medicine

## 2020-03-09 MED ORDER — HYDROCOD POLST-CPM POLST ER 10-8 MG/5ML PO SUER: 5.0000 mL | Freq: Every evening | ORAL | 0 refills | Status: DC | PRN

## 2020-05-27 ENCOUNTER — Other Ambulatory Visit: Payer: Self-pay

## 2020-05-28 ENCOUNTER — Other Ambulatory Visit: Payer: Self-pay

## 2020-05-28 DIAGNOSIS — G4700 Insomnia, unspecified: Secondary | ICD-10-CM

## 2020-05-28 MED ORDER — MONTELUKAST SODIUM 10 MG PO TABS
10.0000 mg | ORAL_TABLET | Freq: Every day | ORAL | 1 refills | Status: DC
Start: 2020-05-28 — End: 2021-12-17

## 2020-05-28 MED ORDER — FLUTICASONE PROPIONATE 50 MCG/ACT NA SUSP: 2.0000 | Freq: Every day | NASAL | 6 refills | Status: AC

## 2020-05-28 MED ORDER — TRAZODONE HCL 50 MG PO TABS: ORAL_TABLET | ORAL | 1 refills | Status: DC

## 2020-05-28 MED ORDER — ALBUTEROL SULFATE HFA 108 (90 BASE) MCG/ACT IN AERS: 2.0000 | INHALATION_SPRAY | Freq: Four times a day (QID) | RESPIRATORY_TRACT | 2 refills | Status: AC | PRN

## 2020-07-08 ENCOUNTER — Other Ambulatory Visit: Payer: Self-pay

## 2020-07-08 ENCOUNTER — Telehealth: Payer: Self-pay

## 2020-07-08 MED ORDER — ETONOGESTREL-ETHINYL ESTRADIOL 0.12-0.015 MG/24HR VA RING
VAGINAL_RING | VAGINAL | 1 refills | Status: DC
Start: 2020-07-08 — End: 2020-11-05

## 2020-07-08 NOTE — Telephone Encounter (Signed)
Pt called in and stated that she needs her bc refill sent to [facility] in [place]. Please advise

## 2020-07-08 NOTE — Telephone Encounter (Signed)
Confirmed that pt requested Nuvaring refill be sent to [facility]. Pt confirmed pharmacy. Informed her rx has been sent (as requested). Pt verbalized understanding.

## 2020-08-03 ENCOUNTER — Telehealth: Payer: Managed Care, Other (non HMO) | Admitting: Nurse Practitioner

## 2020-08-03 DIAGNOSIS — J0100 Acute maxillary sinusitis, unspecified: Secondary | ICD-10-CM | POA: Diagnosis not present

## 2020-08-03 MED ORDER — AMOXICILLIN-POT CLAVULANATE 875-125 MG PO TABS: 1.0000 | ORAL_TABLET | Freq: Two times a day (BID) | ORAL | 0 refills | Status: DC

## 2020-08-03 NOTE — Progress Notes (Signed)

## 2020-08-07 ENCOUNTER — Telehealth: Payer: Managed Care, Other (non HMO) | Admitting: Nurse Practitioner

## 2020-08-07 DIAGNOSIS — R059 Cough, unspecified: Secondary | ICD-10-CM | POA: Diagnosis not present

## 2020-08-07 MED ORDER — PROMETHAZINE-DM 6.25-15 MG/5ML PO SYRP: 5.0000 mL | ORAL_SOLUTION | Freq: Four times a day (QID) | ORAL | 0 refills | Status: DC | PRN

## 2020-08-07 MED ORDER — PREDNISONE 20 MG PO TABS: ORAL_TABLET | ORAL | 0 refills | Status: DC

## 2020-08-07 NOTE — Progress Notes (Signed)
We are sorry that you are not feeling well.  Here is how we plan to help!  Based on your presentation I believe you most likely have A cough due to bacteria.  When patients have a fever and a productive cough with a change in color or increased sputum production, we are concerned about bacterial bronchitis.  If left untreated it can progress to pneumonia.  If your symptoms do not improve with your treatment plan it is important that you contact your provider.  Finish Augmentin as previously prescribed   In addition you may use I have called in promethazine DM cough syrup ( cannot do hycodan cough meds or any other controlled cough meds in an evisit.)  Prednisone 20mg  2 po at same time daily for 5 days.  From your responses in the eVisit questionnaire you describe inflammation in the upper respiratory tract which is causing a significant cough.  This is commonly called Bronchitis and has four common causes:    Allergies  Viral Infections  Acid Reflux  Bacterial Infection Allergies, viruses and acid reflux are treated by controlling symptoms or eliminating the cause. An example might be a cough caused by taking certain blood pressure medications. You stop the cough by changing the medication. Another example might be a cough caused by acid reflux. Controlling the reflux helps control the cough.  USE OF BRONCHODILATOR ("RESCUE") INHALERS: There is a risk from using your bronchodilator too frequently.  The risk is that over-reliance on a medication which only relaxes the muscles surrounding the breathing tubes can reduce the effectiveness of medications prescribed to reduce swelling and congestion of the tubes themselves.  Although you feel brief relief from the bronchodilator inhaler, your asthma may actually be worsening with the tubes becoming more swollen and filled with mucus.  This can delay other crucial treatments, such as oral steroid medications. If you need to use a bronchodilator inhaler  daily, several times per day, you should discuss this with your provider.  There are probably better treatments that could be used to keep your asthma under control.     HOME CARE . Only take medications as instructed by your medical team. . Complete the entire course of an antibiotic. . Drink plenty of fluids and get plenty of rest. . Avoid close contacts especially the very young and the elderly . Cover your mouth if you cough or cough into your sleeve. . Always remember to wash your hands . A steam or ultrasonic humidifier can help congestion.   GET HELP RIGHT AWAY IF: . You develop worsening fever. . You become short of breath . You cough up blood. . Your symptoms persist after you have completed your treatment plan MAKE SURE YOU   Understand these instructions.  Will watch your condition.  Will get help right away if you are not doing well or get worse.  Your e-visit answers were reviewed by a board certified advanced clinical practitioner to complete your personal care plan.  Depending on the condition, your plan could have included both over the counter or prescription medications. If there is a problem please reply  once you have received a response from your provider. Your safety is important to .  If you have drug allergies check your prescription carefully.    You can use MyChart to ask questions about today's visit, request a non-urgent call back, or ask for a work or school excuse for 24 hours related to this e-Visit. If it has been greater than  24 hours you will need to follow up with your provider, or enter a new e-Visit to address those concerns. You will get an e-mail in the next two days asking about your experience.  I hope that your e-visit has been valuable and will speed your recovery. Thank you for using e-visits.  5-10 minutes spent reviewing and documenting in chart.

## 2020-08-17 ENCOUNTER — Telehealth: Payer: Managed Care, Other (non HMO) | Admitting: Nurse Practitioner

## 2020-08-17 DIAGNOSIS — B3731 Acute candidiasis of vulva and vagina: Secondary | ICD-10-CM

## 2020-08-17 DIAGNOSIS — B373 Candidiasis of vulva and vagina: Secondary | ICD-10-CM | POA: Diagnosis not present

## 2020-08-17 MED ORDER — FLUCONAZOLE 150 MG PO TABS: 150.0000 mg | ORAL_TABLET | Freq: Once | ORAL | 0 refills | Status: AC

## 2020-08-17 NOTE — Progress Notes (Signed)

## 2020-08-28 ENCOUNTER — Encounter: Payer: Self-pay | Admitting: *Deleted

## 2020-09-23 ENCOUNTER — Other Ambulatory Visit: Payer: Self-pay | Admitting: Internal Medicine

## 2020-10-25 ENCOUNTER — Encounter: Payer: Managed Care, Other (non HMO) | Admitting: Certified Nurse Midwife

## 2020-11-08 MED ORDER — ETONOGESTREL-ETHINYL ESTRADIOL 0.12-0.015 MG/24HR VA RING: VAGINAL_RING | VAGINAL | 1 refills | Status: DC

## 2020-12-12 ENCOUNTER — Ambulatory Visit (INDEPENDENT_AMBULATORY_CARE_PROVIDER_SITE_OTHER): Payer: Managed Care, Other (non HMO) | Admitting: Certified Nurse Midwife

## 2020-12-12 ENCOUNTER — Other Ambulatory Visit: Payer: Self-pay

## 2020-12-12 ENCOUNTER — Encounter: Payer: Self-pay | Admitting: Certified Nurse Midwife

## 2020-12-12 VITALS — BP 110/74 | HR 71 | Resp 16 | Ht 66.0 in | Wt 183.9 lb

## 2020-12-12 DIAGNOSIS — Z3044 Encounter for surveillance of vaginal ring hormonal contraceptive device: Secondary | ICD-10-CM

## 2020-12-12 DIAGNOSIS — Z01419 Encounter for gynecological examination (general) (routine) without abnormal findings: Secondary | ICD-10-CM

## 2020-12-12 DIAGNOSIS — Z124 Encounter for screening for malignant neoplasm of cervix: Secondary | ICD-10-CM | POA: Diagnosis not present

## 2020-12-12 MED ORDER — ETONOGESTREL-ETHINYL ESTRADIOL 0.12-0.015 MG/24HR VA RING: VAGINAL_RING | VAGINAL | 4 refills | Status: DC

## 2020-12-12 NOTE — Progress Notes (Signed)
ANNUAL PREVENTATIVE CARE GYN  ENCOUNTER NOTE  Subjective:       Kendra Ramos is a 35 y.o. G76P0001 female here for a routine annual gynecologic exam.  Current complaints: 1. Needs Pap smear-works for [facility] 2. Requests Nuvaring refill  Denies difficulty breathing or respiratory distress, chest pain, abdominal pain, excessive vaginal bleeding, dysuria, and leg pain or swelling.    Gynecologic History  Patient's last menstrual period was 12/01/2020 (exact date).  Contraception: NuvaRing vaginal inserts  Last Pap: 05/2018. Results were: normal  Obstetric History  OB History  Gravida Para Term Preterm AB Living  1 0 0 0 0 1  SAB IAB Ectopic Multiple Live Births  0 0 0 0 1    # Outcome Date GA Lbr Len/2nd Weight Sex Delivery Anes PTL Lv  1 Gravida 2015    F Vag-Spont   LIV    Past Medical History:  Diagnosis Date  . Anxiety   . Contraception   . Headache   . Vaginal Pap smear, abnormal     History reviewed. No pertinent surgical history.  Current Outpatient Medications on File Prior to Visit  Medication Sig Dispense Refill  . albuterol (PROAIR HFA) 108 (90 Base) MCG/ACT inhaler Inhale 2 puffs into the lungs every 6 (six) hours as needed for wheezing or shortness of breath. 6.7 g 2  . etonogestrel-ethinyl estradiol (NUVARING) 0.12-0.015 MG/24HR vaginal ring Insert vaginally and leave in place for 3 consecutive weeks, then remove for 1 week. 3 each 1  . fluticasone (FLONASE) 50 MCG/ACT nasal spray Place 2 sprays into both nostrils daily. 16 g 6  . loratadine (CLARITIN) 10 MG tablet Take 10 mg by mouth daily.    . montelukast (SINGULAIR) 10 MG tablet Take 1 tablet (10 mg total) by mouth at bedtime. 90 tablet 1  . traZODone (DESYREL) 50 MG tablet Take 1/2 to 1 tablet by mouth every night at bedtime for sleep. 90 tablet 1   No current facility-administered medications on file prior to visit.    No Known Allergies  Social History   Socioeconomic History  . Marital  status: Married    Spouse name: Not on file  . Number of children: Not on file  . Years of education: Not on file  . Highest education level: Not on file  Occupational History  . Not on file  Tobacco Use  . Smoking status: Never Smoker  . Smokeless tobacco: Never Used  Vaping Use  . Vaping Use: Never used  Substance and Sexual Activity  . Alcohol use: Yes    Alcohol/week: 7.0 standard drinks    Types: 7 Standard drinks or equivalent per week  . Drug use: No  . Sexual activity: Yes    Birth control/protection: Inserts  Other Topics Concern  . Not on file  Social History Narrative  . Not on file   Social Determinants of Health   Financial Resource Strain: Not on file  Food Insecurity: Not on file  Transportation Needs: Not on file  Physical Activity: Not on file  Stress: Not on file  Social Connections: Not on file  Intimate Partner Violence: Not on file    Family History  Problem Relation Age of Onset  . Diabetes Mother   . Cancer Mother        skin ca  . Diabetes Father   . Diabetes Maternal Grandmother   . Diabetes Maternal Grandfather   . Diabetes Paternal Grandmother   . Diabetes Paternal Grandfather   .  Celiac disease Sister     The following portions of the patient's history were reviewed and updated as appropriate: allergies, current medications, past family history, past medical history, past social history, past surgical history and problem list.  Review of Systems  ROS negative except as noted above. Information obtained from patient.    Objective:   BP 110/74   Pulse 71   Resp 16   Ht 5\' 6"  (1.676 m)   Wt 183 lb 14.4 oz (83.4 kg)   BMI 29.68 kg/m    CONSTITUTIONAL: Well-developed, well-nourished female in no acute distress.   PSYCHIATRIC: Normal mood and affect. Normal behavior. Normal judgment and thought content.  [place]: Alert and oriented to person, place, and time. Normal muscle tone coordination. No cranial nerve deficit  noted.  HENT:  Normocephalic, atraumatic.  EYES: Conjunctivae and EOM are normal.   NECK: Normal range of motion, supple, no masses.  Normal thyroid.   SKIN: Skin is warm and dry. No rash noted. Not diaphoretic. No erythema. No pallor.  CARDIOVASCULAR: Normal heart rate noted, regular rhythm, no murmur.  RESPIRATORY: Clear to auscultation bilaterally. Effort and breath sounds normal, no problems with respiration noted.  BREASTS: Symmetric in size. No masses, skin changes, nipple drainage, or lymphadenopathy.  ABDOMEN: Soft, normal bowel sounds, no distention noted.  No tenderness, rebound or guarding.   PELVIC:  External Genitalia: Normal  Vagina: Normal  Cervix: Normal, Pap collected  Uterus: Normal  Adnexa: Normal  MUSCULOSKELETAL: Normal range of motion. No tenderness.  No cyanosis, clubbing, or edema.  2+ distal pulses.  LYMPHATIC: No Axillary, Supraclavicular, or Inguinal Adenopathy.  Depression screen [facility] 2/9 12/12/2020 03/16/2019 02/28/2019 11/30/2016  Decreased Interest 2 0 0 0  Down, Depressed, Hopeless 1 0 0 0  PHQ - 2 Score 3 0 0 0  Altered sleeping 2 0 0 -  Tired, decreased energy 2 0 0 -  Change in appetite 3 0 0 -  Feeling bad or failure about yourself  2 0 0 -  Trouble concentrating 1 0 0 -  Moving slowly or fidgety/restless 3 0 0 -  Suicidal thoughts 0 0 0 -  PHQ-9 Score 16 0 0 -  Difficult doing work/chores Somewhat difficult Not difficult at all Not difficult at all -    Assessment:   Annual gynecologic examination 35 y.o.   Contraception: NuvaRing vaginal inserts   Overweight   Problem List Items Addressed This Visit   None   Visit Diagnoses    Well woman exam    -  Primary      Plan:   Pap: Pap Co Test   Labs: Declined  Routine preventative health maintenance measures emphasized: Exercise/Diet/Weight control, Tobacco Warnings, Alcohol/Substance use risks and Stress Management; see AVS  Encouraged counseling and use of supplements,  list sent via MyChart  Reviewed red flag symptoms and when to call  Return to [place] for [employer] or sooner if neede   Dani Gobble, CNM  [facility], [facility] 12/12/20 10:41 AM

## 2020-12-12 NOTE — Patient Instructions (Signed)
Pap Test Why am I having this test? A Pap test, also called a Pap smear, is a screening test to check for signs of:  Cancer of the vagina, cervix, and uterus. The cervix is the lower part of the uterus that opens into the vagina.  Infection.  Changes that may be a sign that cancer is developing (precancerous changes). Women need this test on a regular basis. In general, you should have a Pap test every 3 years until you reach menopause or age 37. Women aged 30-60 may choose to have their Pap test done at the same time as an HPV (human papillomavirus) test every 5 years (instead of every 3 years). Your health care provider may recommend having Pap tests more or less often depending on your medical conditions and past Pap test results. What kind of sample is taken? Your health care provider will collect a sample of cells from the surface of your cervix. This will be done using a small cotton swab, plastic spatula, or brush. This sample is often collected during a pelvic exam, when you are lying on your back on an exam table with feet in footrests (stirrups). In some cases, fluids (secretions) from the cervix or vagina may also be collected.   How do I prepare for this test?  Be aware of where you are in your menstrual cycle. If you are menstruating on the day of the test, you may be asked to reschedule.  You may need to reschedule if you have a known vaginal infection on the day of the test.  Follow instructions from your health care provider about: ? Changing or stopping your regular medicines. Some medicines can cause abnormal test results, such as digitalis and tetracycline. ? Avoiding douching or taking a bath the day before or the day of the test. Tell a health care provider about:  Any allergies you have.  All medicines you are taking, including vitamins, herbs, eye drops, creams, and over-the-counter medicines.  Any blood disorders you have.  Any surgeries you have had.  Any  medical conditions you have.  Whether you are pregnant or may be pregnant. How are the results reported? Your test results will be reported as either abnormal or normal. A false-positive result can occur. A false positive is incorrect because it means that a condition is present when it is not. A false-negative result can occur. A false negative is incorrect because it means that a condition is not present when it is. What do the results mean? A normal test result means that you do not have signs of cancer of the vagina, cervix, or uterus. An abnormal result may mean that you have:  Cancer. A Pap test by itself is not enough to diagnose cancer. You will have more tests done in this case.  Precancerous changes in your vagina, cervix, or uterus.  Inflammation of the cervix.  An STD (sexually transmitted disease).  A fungal infection.  A parasite infection. Talk with your health care provider about what your results mean. Questions to ask your health care provider Ask your health care provider, or the department that is doing the test:  When will my results be ready?  How will I get my results?  What are my treatment options?  What other tests do I need?  What are my next steps? Summary  In general, women should have a Pap test every 3 years until they reach menopause or age 37.  Your health care provider will collect  secretions) from the cervix or vagina may also be collected. This information is not intended to replace advice given to you by your health care provider. Make sure you discuss any questions you have with your health care provider. Document Revised: 03/27/2020 Document Reviewed: 03/22/2020 Elsevier Patient Education  2021 Elsevier Inc.   Preventive Care 21-39 Years Old, Female Preventive care refers to lifestyle choices and visits with your health care provider  that can promote health and wellness. This includes: A yearly physical exam. This is also called an annual wellness visit. Regular dental and eye exams. Immunizations. Screening for certain conditions. Healthy lifestyle choices, such as: Eating a healthy diet. Getting regular exercise. Not using drugs or products that contain nicotine and tobacco. Limiting alcohol use. What can I expect for my preventive care visit? Physical exam Your health care provider may check your: Height and weight. These may be used to calculate your BMI (body mass index). BMI is a measurement that tells if you are at a healthy weight. Heart rate and blood pressure. Body temperature. Skin for abnormal spots. Counseling Your health care provider may ask you questions about your: Past medical problems. Family's medical history. Alcohol, tobacco, and drug use. Emotional well-being. Home life and relationship well-being. Sexual activity. Diet, exercise, and sleep habits. Work and work environment. Access to firearms. Method of birth control. Menstrual cycle. Pregnancy history. What immunizations do I need? Vaccines are usually given at various ages, according to a schedule. Your health care provider will recommend vaccines for you based on your age, medical history, and lifestyle or other factors, such as travel or where you work.   What tests do I need? Blood tests Lipid and cholesterol levels. These may be checked every 5 years starting at age 20. Hepatitis C test. Hepatitis B test. Screening Diabetes screening. This is done by checking your blood sugar (glucose) after you have not eaten for a while (fasting). STD (sexually transmitted disease) testing, if you are at risk. BRCA-related cancer screening. This may be done if you have a family history of breast, ovarian, tubal, or peritoneal cancers. Pelvic exam and Pap test. This may be done every 3 years starting at age 21. Starting at age 30, this may  be done every 5 years if you have a Pap test in combination with an HPV test. Talk with your health care provider about your test results, treatment options, and if necessary, the need for more tests.   Follow these instructions at home: Eating and drinking Eat a healthy diet that includes fresh fruits and vegetables, whole grains, lean protein, and low-fat dairy products. Take vitamin and mineral supplements as recommended by your health care provider. Do not drink alcohol if: Your health care provider tells you not to drink. You are pregnant, may be pregnant, or are planning to become pregnant. If you drink alcohol: Limit how much you have to 0-1 drink a day. Be aware of how much alcohol is in your drink. In the [place] one drink equals one 12 oz bottle of beer (355 mL), one 5 oz glass of wine (148 mL), or one 1 oz glass of hard liquor (44 mL).   Lifestyle Take daily care of your teeth and gums. Brush your teeth every morning and night with fluoride toothpaste. Floss one time each day. Stay active. Exercise for at least 30 minutes 5 or more days each week. Do not use any products that contain nicotine or tobacco, such as cigarettes, e-cigarettes, and chewing tobacco. If   you need help quitting, ask your health care provider. Do not use drugs. If you are sexually active, practice safe sex. Use a condom or other form of protection to prevent STIs (sexually transmitted infections). If you do not wish to become pregnant, use a form of birth control. If you plan to become pregnant, see your health care provider for a prepregnancy visit. Find healthy ways to cope with stress, such as: Meditation, yoga, or listening to music. Journaling. Talking to a trusted person. Spending time with friends and family. Safety Always wear your seat belt while driving or riding in a vehicle. Do not drive: If you have been drinking alcohol. Do not ride with someone who has been drinking. When you are tired or  distracted. While texting. Wear a helmet and other protective equipment during sports activities. If you have firearms in your house, make sure you follow all gun safety procedures. Seek help if you have been physically or sexually abused. What's next? Go to your health care provider once a year for an annual wellness visit. Ask your health care provider how often you should have your eyes and teeth checked. Stay up to date on all vaccines. This information is not intended to replace advice given to you by your health care provider. Make sure you discuss any questions you have with your health care provider. Document Revised: 03/17/2020 Document Reviewed: 03/31/2018 Elsevier Patient Education  2021 Elsevier Inc.  

## 2020-12-18 LAB — IGP, COBASHPV16/18
HPV 16: NEGATIVE
HPV 18: NEGATIVE
HPV other hr types: NEGATIVE

## 2021-11-12 ENCOUNTER — Ambulatory Visit (INDEPENDENT_AMBULATORY_CARE_PROVIDER_SITE_OTHER): Payer: Managed Care, Other (non HMO) | Admitting: Obstetrics and Gynecology

## 2021-11-12 ENCOUNTER — Encounter: Payer: Self-pay | Admitting: Obstetrics and Gynecology

## 2021-11-12 VITALS — BP 111/80 | HR 66 | Resp 16 | Ht 66.0 in | Wt 207.8 lb

## 2021-11-12 DIAGNOSIS — Z6833 Body mass index (BMI) 33.0-33.9, adult: Secondary | ICD-10-CM

## 2021-11-12 DIAGNOSIS — R6882 Decreased libido: Secondary | ICD-10-CM | POA: Diagnosis not present

## 2021-11-12 DIAGNOSIS — Z713 Dietary counseling and surveillance: Secondary | ICD-10-CM

## 2021-11-12 DIAGNOSIS — E669 Obesity, unspecified: Secondary | ICD-10-CM | POA: Diagnosis not present

## 2021-11-12 NOTE — Progress Notes (Signed)
? ?GYNECOLOGY CLINIC PROGRESS NOTE ?Subjective:  ?  ? Kendra Ramos is a 36 y.o.G1P0001 female here for discussion regarding weight loss. She was previously seen by Dani Gobble, CNM.  She has noted a weight gain of approximately 25 pounds over the last 1 year. She feels ideal weight is 175 pounds.  History of eating disorders: none. There is a family history positive for obesity in the patient. Previous treatments for obesity include prescription appetite suppressants: Phentermine (remote use several years ago) and self-directed dieting. Obesity associated medical conditions: none. Obesity associated medications: none, notes she discontinued her Trazadone "a while back". Cardiovascular risk factors besides obesity: obesity (BMI >= 30 kg/m2). Notes that although she is a social person, since [place] she has had to be more of a homebody.  Has been trying to exercise at home but feels like it is not helping. Drinks plenty of water.  Makes small dietary changes but does no do well with diet plans. Reports that she did not like use Phentermine in the past, due to jitteriness.  Also felt like it increased her anxiety slightly.  ? ?Additionally, patient complains of decreased libido.  Has been going on for at least 1 year. Unsure cause. Denies any marital strife or new life stressors.  Currently on NuvaRing for contraception, has been using for the past 5-6 years.  ? ?The following portions of the patient's history were reviewed and updated as appropriate:  ?She  has a past medical history of Anxiety, Contraception, Headache, and Vaginal Pap smear, abnormal. ? ?Her family history includes Cancer in her mother; Celiac disease in her sister; Diabetes in her father, maternal grandfather, maternal grandmother, mother, paternal grandfather, and paternal grandmother. ? ?Current Outpatient Medications on File Prior to Visit  ?Medication Sig Dispense Refill  ? albuterol (PROAIR HFA) 108 (90 Base) MCG/ACT inhaler Inhale 2  puffs into the lungs every 6 (six) hours as needed for wheezing or shortness of breath. 6.7 g 2  ? etonogestrel-ethinyl estradiol (NUVARING) 0.12-0.015 MG/24HR vaginal ring Insert vaginally and leave in place for 3 consecutive weeks, then remove for 1 week. 3 each 4  ? fluticasone (FLONASE) 50 MCG/ACT nasal spray Place 2 sprays into both nostrils daily. 16 g 6  ? loratadine (CLARITIN) 10 MG tablet Take 10 mg by mouth daily.    ? montelukast (SINGULAIR) 10 MG tablet Take 1 tablet (10 mg total) by mouth at bedtime. 90 tablet 1  ? ?No current facility-administered medications on file prior to visit.  ? ?She has No Known Allergies.. ? ?Review of Systems ?Pertinent items noted in HPI and remainder of comprehensive ROS otherwise negative. ?  ?Objective:  ? ?Blood pressure 111/80, pulse 66, resp. rate 16, height '5\' 6"'$  (1.676 m), weight 207 lb 12.8 oz (94.3 kg). Body mass index is 33.54 kg/m?. ?General appearance: alert and no distress ?Abdomen: soft, non-tender; bowel sounds normal; no masses,  no organomegaly.  Waist circumference 36 inches.  ? ?  ?Assessment:  ? ?Obesity, Class 1. I assessed Anneta to be in an action stage with respect to weight loss. ? Decreased libido ? ?Plan:  ? ?- General weight loss/lifestyle modification strategies discussed (elicit support from others; identify saboteurs; non-food rewards, etc). ?- Diet interventions: qualitative changes (increase low-fat,  high-fiber foods). ?- Informal exercise measures discussed, e.g. taking stairs instead of elevator. ?- Medication: Contrave. Discussed risks, benefits. Given samples to trial. If patient desires to continue, will prescribe. Advised that if cost-prohibitive, can consider resumption of Phentermine at  low dose. . ?- Discussed possible causes of decreased libido, including hormonal, body image issues. Less likely due to contraception as symptoms are relatively new within a year) and patient has been on NuvaRing for over 5 years). Will check  hormonal levels today. If no significant findings, can also discuss possible medication options. Patient reports that she recently ordered an herbal supplement online to help with libido.  Will try this first.  ?- Follow up in: 1 month.  ? ? ?A total of 25 minutes were spent face-to-face with the patient during this encounter and over half of that time dealt with counseling and coordination of care. ? ? ?Rubie Maid, MD ?[facility]  ?

## 2021-11-12 NOTE — Patient Instructions (Signed)
Bupropion; Naltrexone extended-release tablets ?What is this medication? ?BUPROPION; NALTREXONE (byoo PROE pee on; nal TREX one) is a combination of two drugs that help you lose weight. This product is used with a reduced calorie diet and exercise. This product can also help you maintain weight loss. ?This medicine may be used for other purposes; ask your health care provider or pharmacist if you have questions. ?COMMON BRAND NAME(S): Contrave ?What should I tell my care team before I take this medication? ?They need to know if you have any of these conditions: ?an eating disorder, such as anorexia or bulimia ?diabetes ?depression ?glaucoma ?head injury ?heart disease ?high blood pressure ?history of drug abuse or alcohol abuse problem ?history of a tumor or infection of your brain or spine ?history of heart attack or stroke ?history of irregular heartbeat ?if you often drink alcohol ?kidney disease ?liver disease ?low levels of sodium in the blood ?mental illness ?seizures ?suicidal thoughts, plans, or attempt; a previous suicide attempt by you or a family member ?taken an MAOI like Carbex, Eldepryl, Marplan, Nardil, or Parnate in last 14 days ?an unusual or allergic reaction to bupropion, naltrexone, other medicines, foods, dyes, or preservatives ?breast-feeding ?pregnant or trying to become pregnant ?How should I use this medication? ?Take this medicine by mouth with a glass of water. Follow the directions on the prescription label. Do not cut, crush or chew this medicine. Swallow the tablets whole. You can take it with or without food. Do not take with high-fat meals as this may increase your risk of seizures. Take your medicine at regular intervals. Do not take it more often than directed. Do not stop taking except on your doctor's advice. ?A special MedGuide will be given to you by the pharmacist with each prescription and refill. Be sure to read this information carefully each time. ?Talk to your pediatrician  regarding the use of this medicine in children. Special care may be needed. ?Overdosage: If you think you have taken too much of this medicine contact a poison control center or emergency room at once. ?NOTE: This medicine is only for you. Do not share this medicine with others. ?What if I miss a dose? ?If you miss a dose, skip it. Take your next dose at the normal time. Do not take extra or 2 doses at the same time to make up for the missed dose. ?What may interact with this medication? ?Do not take this medicine with any of the following medications: ?any medicines used to stop taking opioids such as methadone or buprenorphine ?linezolid ?MAOIs like Carbex, Eldepryl, Marplan, Nardil, and Parnate ?methylene blue (injected into a vein) ?often take narcotic medicines for pain or cough ?other medicines that contain bupropion like Zyban or Wellbutrin ?This medicine may also interact with the following medications: ?alcohol ?certain medicines for blood pressure like metoprolol, propranolol ?certain medicines for depression, anxiety, or psychotic disturbances ?certain medicines for HIV or hepatitis ?certain medicines for irregular heart beat like propafenone, flecainide ?certain medicines for Parkinson's disease like amantadine, levodopa ?certain medicines for seizures like carbamazepine, phenytoin, phenobarbital ?certain medicines for sleep ?cimetidine ?clopidogrel ?cyclophosphamide ?digoxin ?disulfiram ?furazolidone ?isoniazid ?nicotine ?orphenadrine ?procarbazine ?steroid medicines like prednisone or cortisone ?stimulant medicines for attention disorders, weight loss, or to stay awake ?tamoxifen ?theophylline ?thiotepa ?ticlopidine ?tramadol ?warfarin ?This list may not describe all possible interactions. Give your health care provider a list of all the medicines, herbs, non-prescription drugs, or dietary supplements you use. Also tell them if you smoke, drink alcohol, or use  illegal drugs. Some items may interact  with your medicine. ?What should I watch for while using this medication? ?Visit your doctor or healthcare provider for regular checks on your progress. ?This medicine may cause serious skin reactions. They can happen weeks to months after starting the medicine. Contact your healthcare provider right away if you notice fevers or flu-like symptoms with a rash. The rash may be red or purple and then turn into blisters or peeling of the skin. Or, you might notice a red rash with swelling of the face, lips or lymph nodes in your neck or under your arms. ?This medicine may affect blood sugar. Ask your healthcare provider if changes in diet or medicines are needed if you have diabetes. ?Patients and their families should watch out for new or worsening depression or thoughts of suicide. Also watch out for sudden changes in feelings such as feeling anxious, agitated, panicky, irritable, hostile, aggressive, impulsive, severely restless, overly excited and hyperactive, or not being able to sleep. If this happens, especially at the beginning of treatment or after a change in dose, call your healthcare provider. ?Avoid alcoholic drinks while taking this medicine. Drinking large amounts of alcoholic beverages, using sleeping or anxiety medicines, or quickly stopping the use of these agents while taking this medicine may increase your risk for a seizure. ?Do not drive or use heavy machinery until you know how this medicine affects you. This medicine can impair your ability to perform these tasks. ?Women should inform their health care provider if they wish to become pregnant or think they might be pregnant. Losing weight while pregnant is not advised and may cause harm to the unborn child. Talk to your health care provider for more information. ?What side effects may I notice from receiving this medication? ?Side effects that you should report to your doctor or health care professional as soon as possible: ?allergic reactions  like skin rash, itching or hives, swelling of the face, lips, or tongue ?breathing problems ?changes in vision ?confusion ?elevated mood, decreased need for sleep, racing thoughts, impulsive behavior ?fast or irregular heartbeat ?hallucinations, loss of contact with reality ?increased blood pressure ?rash, fever, and swollen lymph nodes ?redness, blistering, peeling, or loosening of the skin, including inside the mouth ?seizures ?signs and symptoms of liver injury like dark yellow or brown urine; general ill feeling or flu-like symptoms; light-colored stools; loss of appetite; nausea; right upper belly pain; unusually weak or tired; yellowing of the eyes or skin ?suicidal thoughts or other mood changes ?vomiting ?Side effects that usually do not require medical attention (report to your doctor or health care professional if they continue or are bothersome): ?constipation ?headache ?loss of appetite ?indigestion, stomach upset ?tremors ?This list may not describe all possible side effects. Call your doctor for medical advice about side effects. You may report side effects to FDA at 1-800-FDA-1088. ?Where should I keep my medication? ?Keep out of the reach of children. ?Store at room temperature between 15 and 30 degrees C (59 and 86 degrees F). Throw away any unused medicine after the expiration date. ?NOTE: This sheet is a summary. It may not cover all possible information. If you have questions about this medicine, talk to your doctor, pharmacist, or health care provider. ?? 2022 Elsevier/Gold Standard (2021-04-08 00:00:00) ? ?

## 2021-11-13 LAB — FSH/LH
FSH: 7.5 m[IU]/mL
LH: 4 m[IU]/mL

## 2021-11-13 LAB — ESTRADIOL: Estradiol: <5 pg/mL

## 2021-11-13 LAB — TESTOSTERONE, FREE, TOTAL, SHBG
Sex Hormone Binding: 239 nmol/L — ABNORMAL HIGH (ref 24.6–122.0)
Testosterone, Free: 0.4 pg/mL (ref 0.0–4.2)
Testosterone: <3 ng/dL — ABNORMAL LOW (ref 8–60)

## 2021-11-13 LAB — TSH: TSH: 1.69 u[IU]/mL (ref 0.450–4.500)

## 2021-11-24 ENCOUNTER — Telehealth: Payer: Self-pay | Admitting: Obstetrics and Gynecology

## 2021-11-24 NOTE — Telephone Encounter (Signed)
is requesting for her physical and apt for weight loss to be in the same day- can we schedule in same day?  ?

## 2021-11-24 NOTE — Telephone Encounter (Signed)
Pt wanted to report that she used sample of contrave- states that she was experiencing ":killer headaches" and was wanting to know if she could try something else. Please advise.  ?

## 2021-11-25 NOTE — Telephone Encounter (Signed)
Please have patient contact her insurance company to see what options may be available for her for weight loss management, as every company has different options.

## 2021-11-26 NOTE — Telephone Encounter (Signed)
Sent message to pt

## 2021-12-15 NOTE — Progress Notes (Signed)
GYNECOLOGY ANNUAL PHYSICAL EXAM PROGRESS NOTE  Subjective:    Kendra Ramos is a 36 y.o. [id number] female who presents for an annual exam. The patient has no complaints today. The patient is somewhat sexually active. The patient participates in regular exercise: yes. Has the patient ever been transfused or tattooed?: no. The patient reports that there is not domestic violence in her life.   Reports getting sunburned at the beach this weekend, despite use of high SPF sunblock.  Still desires to discuss weight management further. Notes that she contacted her insurance company about weight loss options, and most were not cost-effective.  Notes that is taking some OTC supplements to help with her libido.  Read handout regarding Adyyi and desires to hold off at this time.   Menstrual History: Menarche age: 67 Patient's last menstrual period was 11/15/2021. Period Cycle (Days): 30 Period Duration (Days): 5 Period Pattern: Regular Menstrual Flow: Moderate, Heavy, Light Menstrual Control: Other (Comment) (Cup) Dysmenorrhea: (!) Mild Dysmenorrhea Symptoms: Cramping, Diarrhea   Gynecologic History:  Contraception: NuvaRing vaginal inserts History of STI's: Denies Last Pap: 05/18/2018. Results were: abnormal.  Denies h/o abnormal pap smears.   2023    0913  Pregnancy Intention Screening   Does the patient want to become pregnant in the next year? No  Does the patient's partner want to become pregnant in the next year? No  Does the patient currently take folic acid or women's MVI, or a prenatal viitamin? No  Does the patient or their partner want to learn more about planning a healthy pregnancy? No  Would the patient like to discuss contraceptive options today? No  Contraception Wrap Up   Current Method Vaginal Ring  End Method Vaginal Ring  Contraception Counseling Provided No    The pregnancy intention screening data noted above was reviewed. Potential methods of  contraception were discussed. The patient elected to proceed with Vaginal Ring.   OB History  Gravida Para Term Preterm AB Living  1 0 0 0 0 1  SAB IAB Ectopic Multiple Live Births  0 0 0 0 1    # Outcome Date GA Lbr Len/2nd Weight Sex Delivery Anes PTL Lv  1 Gravida 2015    F Vag-Spont   LIV    Past Medical History:  Diagnosis Date   Anxiety    Contraception    Headache    Vaginal Pap smear, abnormal     History reviewed. No pertinent surgical history.   Family History  Problem Relation Age of Onset   Diabetes Mother    Cancer Mother        skin ca   Diabetes Father    Diabetes Maternal Grandmother    Diabetes Maternal Grandfather    Diabetes Paternal Grandmother    Diabetes Paternal Grandfather    Celiac disease Sister     Social History   Socioeconomic History   Marital status: Married    Spouse name: Not on file   Number of children: Not on file   Years of education: Not on file   Highest education level: Not on file  Occupational History   Not on file  Tobacco Use   Smoking status: Never   Smokeless tobacco: Never  Vaping Use   Vaping Use: Never used  Substance and Sexual Activity   Alcohol use: Yes    Alcohol/week: 7.0 standard drinks    Types: 7 Standard drinks or equivalent per week   Drug use: No   Sexual  activity: Yes    Birth control/protection: Inserts  Other Topics Concern   Not on file  Social History Narrative   Not on file   Social Determinants of Health   Financial Resource Strain: Not on file  Food Insecurity: Not on file  Transportation Needs: Not on file  Physical Activity: Not on file  Stress: Not on file  Social Connections: Not on file  Intimate Partner Violence: Not on file    Current Outpatient Medications on File Prior to Visit  Medication Sig Dispense Refill   albuterol (PROAIR HFA) 108 (90 Base) MCG/ACT inhaler Inhale 2 puffs into the lungs every 6 (six) hours as needed for wheezing or shortness of breath. 6.7 g  2   etonogestrel-ethinyl estradiol (NUVARING) 0.12-0.015 MG/24HR vaginal ring Insert vaginally and leave in place for 3 consecutive weeks, then remove for 1 week. 3 each 4   fluticasone (FLONASE) 50 MCG/ACT nasal spray Place 2 sprays into both nostrils daily. 16 g 6   loratadine (CLARITIN) 10 MG tablet Take 10 mg by mouth daily.     montelukast (SINGULAIR) 10 MG tablet Take 1 tablet (10 mg total) by mouth at bedtime. 90 tablet 1   No current facility-administered medications on file prior to visit.    No Known Allergies   Review of Systems Constitutional: negative for chills, fatigue, fevers and sweats Eyes: negative for irritation, redness and visual disturbance Ears, nose, mouth, throat, and face: negative for hearing loss, nasal congestion, snoring and tinnitus Respiratory: negative for asthma, cough, sputum Cardiovascular: negative for chest pain, dyspnea, exertional chest pressure/discomfort, irregular heart beat, palpitations and syncope Gastrointestinal: negative for abdominal pain, change in bowel habits, nausea and vomiting Genitourinary: negative for abnormal menstrual periods, genital lesions, sexual problems and vaginal discharge, dysuria and urinary incontinence Integument/breast: negative for breast lump, breast tenderness and nipple discharge Hematologic/lymphatic: negative for bleeding and easy bruising Musculoskeletal:negative for back pain and muscle weakness Neurological: negative for dizziness, headaches, vertigo and weakness Endocrine: negative for diabetic symptoms including polydipsia, polyuria and skin dryness Allergic/Immunologic: negative for hay fever and urticaria      Objective:  Blood pressure 121/64, pulse 65, resp. rate 16, height '5\' 6"'$  (1.676 m), weight 213 lb (96.6 kg), last menstrual period 11/15/2021.  Body mass index is 34.38 kg/m.  General Appearance:    Alert, cooperative, no distress, appears stated age, mild obesity  Head:    Normocephalic,  without obvious abnormality, atraumatic  Eyes:    PERRL, conjunctiva/corneas clear, EOM's intact, both eyes  Ears:    Normal external ear canals, both ears  Nose:   Nares normal, septum midline, mucosa normal, no drainage or sinus tenderness  Throat:   Lips, mucosa, and tongue normal; teeth and gums normal  Neck:   Supple, symmetrical, trachea midline, no adenopathy; thyroid: no enlargement/tenderness/nodules; no carotid bruit or JVD  Back:     Symmetric, no curvature, ROM normal, no CVA tenderness  Lungs:     Clear to auscultation bilaterally, respirations unlabored  Chest Wall:    No tenderness or deformity   Heart:    Regular rate and rhythm, S1 and S2 normal, no murmur, rub or gallop  Breast Exam:    No tenderness, masses, or nipple abnormality  Abdomen:     Soft, non-tender, bowel sounds active all four quadrants, no masses, no organomegaly.    Genitalia:    Pelvic:external genitalia normal, vagina without lesions, discharge, or tenderness, rectovaginal septum  normal. Cervix normal in appearance, no cervical motion  tenderness, no adnexal masses or tenderness.  Uterus normal size, shape, mobile, regular contours, nontender.  Rectal:    Normal external sphincter.  No hemorrhoids appreciated. Internal exam not done.   Extremities:   Extremities normal, atraumatic, no cyanosis or edema  Pulses:   2+ and symmetric all extremities  Skin:   Skin color, texture, turgor normal, no rashes or lesions  Lymph nodes:   Cervical, supraclavicular, and axillary nodes normal  Neurologic:   CNII-XII intact, normal strength, sensation and reflexes throughout    Labs:  Lab Results  Component Value Date   WBC 6.8 05/11/2019   HGB 12.6 05/11/2019   HCT 38.6 05/11/2019   MCV 91 05/11/2019   PLT 273 05/11/2019    Lab Results  Component Value Date   CREATININE 0.86 05/11/2019   BUN 10 05/11/2019   NA 139 05/11/2019   K 3.8 05/11/2019   CL 103 05/11/2019   CO2 24 05/11/2019    Lab Results   Component Value Date   ALT 16 05/11/2019   AST 21 05/11/2019   ALKPHOS 41 05/11/2019   BILITOT 0.3 05/11/2019    Lab Results  Component Value Date   TSH 1.690 11/12/2021     Assessment:   1. Well woman exam   2. Cervical cancer screening   3. Screening cholesterol level   4. Screening for diabetes mellitus (DM)   5. Obesity (BMI 30.0-34.9)   6. Libido, decreased      Plan:  Blood tests: Comprehensive metabolic panel, Total cholesterol, TSH, and CBC, Hemoglobin A1c. Breast self exam technique reviewed and patient encouraged to perform self-exam monthly. Contraception: NuvaRing vaginal inserts. Discussed healthy lifestyle modifications. Mammogram  : Not age appropriate Pap smear ordered. Discussed weight management options, will attempt to prescribe Saxenda. Has used Phentermine in the past but did not like side effects.   Decreased libido, discussed other OTC herbal remedies.  Follow up in 1 year for annual exam. RTC in 2-3 months to f/u weight management.    Rubie Maid, MD [facility]

## 2021-12-17 ENCOUNTER — Encounter: Payer: Managed Care, Other (non HMO) | Admitting: Certified Nurse Midwife

## 2021-12-17 ENCOUNTER — Encounter: Payer: Self-pay | Admitting: Obstetrics and Gynecology

## 2021-12-17 ENCOUNTER — Ambulatory Visit (INDEPENDENT_AMBULATORY_CARE_PROVIDER_SITE_OTHER): Payer: Managed Care, Other (non HMO) | Admitting: Obstetrics and Gynecology

## 2021-12-17 ENCOUNTER — Other Ambulatory Visit: Payer: Self-pay | Admitting: Obstetrics and Gynecology

## 2021-12-17 VITALS — BP 121/64 | HR 65 | Resp 16 | Ht 66.0 in | Wt 213.0 lb

## 2021-12-17 DIAGNOSIS — Z13220 Encounter for screening for lipoid disorders: Secondary | ICD-10-CM

## 2021-12-17 DIAGNOSIS — Z124 Encounter for screening for malignant neoplasm of cervix: Secondary | ICD-10-CM

## 2021-12-17 DIAGNOSIS — Z131 Encounter for screening for diabetes mellitus: Secondary | ICD-10-CM | POA: Diagnosis not present

## 2021-12-17 DIAGNOSIS — Z01419 Encounter for gynecological examination (general) (routine) without abnormal findings: Secondary | ICD-10-CM | POA: Diagnosis not present

## 2021-12-17 DIAGNOSIS — E669 Obesity, unspecified: Secondary | ICD-10-CM

## 2021-12-17 DIAGNOSIS — R6882 Decreased libido: Secondary | ICD-10-CM

## 2021-12-17 MED ORDER — SAXENDA 18 MG/3ML ~~LOC~~ SOPN: PEN_INJECTOR | SUBCUTANEOUS | 6 refills | Status: DC

## 2021-12-17 NOTE — Patient Instructions (Addendum)

## 2021-12-18 LAB — HEMOGLOBIN A1C
Est. average glucose Bld gHb Est-mCnc: 97 mg/dL
Hgb A1c MFr Bld: 5 % (ref 4.8–5.6)

## 2021-12-18 LAB — COMPREHENSIVE METABOLIC PANEL
ALT: 19 IU/L (ref 0–32)
AST: 24 IU/L (ref 0–40)
Albumin/Globulin Ratio: 2 (ref 1.2–2.2)
Albumin: 4.3 g/dL (ref 3.8–4.8)
Alkaline Phosphatase: 40 IU/L — ABNORMAL LOW (ref 44–121)
BUN/Creatinine Ratio: 19 (ref 9–23)
BUN: 15 mg/dL (ref 6–20)
Bilirubin Total: <0.2 mg/dL (ref 0.0–1.2)
CO2: 22 mmol/L (ref 20–29)
Calcium: 9.5 mg/dL (ref 8.7–10.2)
Chloride: 102 mmol/L (ref 96–106)
Creatinine, Ser: 0.79 mg/dL (ref 0.57–1.00)
Globulin, Total: 2.2 g/dL (ref 1.5–4.5)
Glucose: 94 mg/dL (ref 70–99)
Potassium: 4.7 mmol/L (ref 3.5–5.2)
Sodium: 137 mmol/L (ref 134–144)
Total Protein: 6.5 g/dL (ref 6.0–8.5)
eGFR: 99 mL/min/{1.73_m2} (ref >59)

## 2021-12-18 LAB — CBC
Hematocrit: 36.9 % (ref 34.0–46.6)
Hemoglobin: 12.7 g/dL (ref 11.1–15.9)
MCH: 31.1 pg (ref 26.6–33.0)
MCHC: 34.4 g/dL (ref 31.5–35.7)
MCV: 90 fL (ref 79–97)
Platelets: 251 10*3/uL (ref 150–450)
RBC: 4.09 x10E6/uL (ref 3.77–5.28)
RDW: 11.8 % (ref 11.7–15.4)
WBC: 5.7 10*3/uL (ref 3.4–10.8)

## 2021-12-18 LAB — TSH: TSH: 2.87 u[IU]/mL (ref 0.450–4.500)

## 2021-12-18 LAB — LIPID PANEL
Chol/HDL Ratio: 2 ratio (ref 0.0–4.4)
Cholesterol, Total: 166 mg/dL (ref 100–199)
HDL: 84 mg/dL (ref >39)
LDL Chol Calc (NIH): 62 mg/dL (ref 0–99)
Triglycerides: 117 mg/dL (ref 0–149)
VLDL Cholesterol Cal: 20 mg/dL (ref 5–40)

## 2021-12-18 MED ORDER — SAXENDA 18 MG/3ML ~~LOC~~ SOPN
PEN_INJECTOR | SUBCUTANEOUS | 6 refills | Status: DC
Start: 2021-12-18 — End: 2022-02-19

## 2021-12-19 ENCOUNTER — Encounter: Payer: Managed Care, Other (non HMO) | Admitting: Certified Nurse Midwife

## 2021-12-23 ENCOUNTER — Telehealth: Payer: Self-pay | Admitting: Obstetrics and Gynecology

## 2021-12-23 ENCOUNTER — Encounter: Payer: Managed Care, Other (non HMO) | Admitting: Certified Nurse Midwife

## 2021-12-23 NOTE — Telephone Encounter (Signed)
Pt is inquiring about weight loss medication- PA? Please advise.

## 2021-12-26 LAB — IGP, COBASHPV16/18
HPV 16: NEGATIVE
HPV 18: NEGATIVE
HPV other hr types: NEGATIVE

## 2022-01-15 ENCOUNTER — Other Ambulatory Visit: Payer: Self-pay

## 2022-01-15 MED ORDER — ETONOGESTREL-ETHINYL ESTRADIOL 0.12-0.015 MG/24HR VA RING: VAGINAL_RING | VAGINAL | 4 refills | Status: DC

## 2022-02-18 ENCOUNTER — Other Ambulatory Visit: Payer: Self-pay | Admitting: Obstetrics and Gynecology

## 2022-02-23 ENCOUNTER — Encounter: Payer: Self-pay | Admitting: Obstetrics and Gynecology

## 2022-04-30 ENCOUNTER — Encounter: Payer: Self-pay | Admitting: Dermatology

## 2022-04-30 ENCOUNTER — Ambulatory Visit: Payer: Managed Care, Other (non HMO) | Admitting: Dermatology

## 2022-04-30 DIAGNOSIS — D1724 Benign lipomatous neoplasm of skin and subcutaneous tissue of left leg: Secondary | ICD-10-CM | POA: Diagnosis not present

## 2022-04-30 DIAGNOSIS — L820 Inflamed seborrheic keratosis: Secondary | ICD-10-CM | POA: Diagnosis not present

## 2022-04-30 DIAGNOSIS — D229 Melanocytic nevi, unspecified: Secondary | ICD-10-CM

## 2022-04-30 DIAGNOSIS — L304 Erythema intertrigo: Secondary | ICD-10-CM

## 2022-04-30 DIAGNOSIS — L578 Other skin changes due to chronic exposure to nonionizing radiation: Secondary | ICD-10-CM

## 2022-04-30 DIAGNOSIS — Z1283 Encounter for screening for malignant neoplasm of skin: Secondary | ICD-10-CM | POA: Diagnosis not present

## 2022-04-30 DIAGNOSIS — C439 Malignant melanoma of skin, unspecified: Secondary | ICD-10-CM

## 2022-04-30 DIAGNOSIS — Z808 Family history of malignant neoplasm of other organs or systems: Secondary | ICD-10-CM

## 2022-04-30 DIAGNOSIS — D485 Neoplasm of uncertain behavior of skin: Secondary | ICD-10-CM

## 2022-04-30 DIAGNOSIS — L814 Other melanin hyperpigmentation: Secondary | ICD-10-CM

## 2022-04-30 DIAGNOSIS — L821 Other seborrheic keratosis: Secondary | ICD-10-CM

## 2022-04-30 DIAGNOSIS — D492 Neoplasm of unspecified behavior of bone, soft tissue, and skin: Secondary | ICD-10-CM

## 2022-04-30 DIAGNOSIS — L813 Cafe au lait spots: Secondary | ICD-10-CM

## 2022-04-30 DIAGNOSIS — D1801 Hemangioma of skin and subcutaneous tissue: Secondary | ICD-10-CM

## 2022-04-30 HISTORY — DX: Malignant melanoma of skin, unspecified: C43.9

## 2022-04-30 MED ORDER — KETOCONAZOLE 2 % EX CREA: 1.0000 | TOPICAL_CREAM | CUTANEOUS | 6 refills | Status: AC

## 2022-04-30 MED ORDER — HYDROCORTISONE 2.5 % EX CREA: TOPICAL_CREAM | CUTANEOUS | 6 refills | Status: AC

## 2022-04-30 NOTE — Progress Notes (Signed)
New Patient Visit  Subjective  Kendra Ramos is a 36 y.o. female who presents for the following: check spot (L lower leg, yrs, may be changing/L ant thigh, yrs, has gotten tender in past few weeks), Rash (Bil axilla, itchy, changed deodorants from Lume to mineral salts called Naked, was given to prescription creams Ketoconazole 2% cr and another cream that could only use a few days a week, pt has not been on the 2 prescriptions creams since July but axilla still itchy), and Total Body skin exam (No hx of skin cancer, pt mother with hx of Melanoma). The patient presents for Total-Body Skin Exam (TBSE) for skin cancer screening and mole check.  The patient has spots, moles and lesions to be evaluated, some may be new or changing and the patient has concerns that these could be cancer.   The following portions of the chart were reviewed this encounter and updated as appropriate:   Tobacco  Allergies  Meds  Problems  Med Hx  Surg Hx  Fam Hx     Review of Systems:  No other skin or systemic complaints except as noted in HPI or Assessment and Plan.  Objective  Well appearing patient in no apparent distress; mood and affect are within normal limits.  A full examination was performed including scalp, head, eyes, ears, nose, lips, neck, chest, axillae, abdomen, back, buttocks, bilateral upper extremities, bilateral lower extremities, hands, feet, fingers, toes, fingernails, and toenails. All findings within normal limits unless otherwise noted below.  L deltoid Brown macule  Left Breast x 1 Stuck on waxy pap with erythema 1.5cm  Axilla Erythema axilla  L prox lat pretibial 0.7cm irregular brown macule       L ant thigh 0.7cm firm pap   Assessment & Plan   Lentigines - Scattered tan macules - Due to sun exposure - Benign-appearing, observe - Recommend daily broad spectrum sunscreen SPF 30+ to sun-exposed areas, reapply every 2 hours as needed. - Call for any  changes - arms  Seborrheic Keratoses - Stuck-on, waxy, tan-brown papules and/or plaques  - Benign-appearing - Discussed benign etiology and prognosis. - Observe - Call for any changes - face, trunk  Melanocytic Nevi - Tan-brown and/or pink-flesh-colored symmetric macules and papules - Benign appearing on exam today - Observation - Call clinic for new or changing moles - Recommend daily use of broad spectrum spf 30+ sunscreen to sun-exposed areas.  - arms, trunk  Hemangiomas - Red papules - Discussed benign nature - Observe - Call for any changes  Actinic Damage - Chronic condition, secondary to cumulative UV/sun exposure - diffuse scaly erythematous macules with underlying dyspigmentation - Recommend daily broad spectrum sunscreen SPF 30+ to sun-exposed areas, reapply every 2 hours as needed.  - Staying in the shade or wearing long sleeves, sun glasses (UVA+UVB protection) and wide brim hats (4-inch brim around the entire circumference of the hat) are also recommended for sun protection.  - Call for new or changing lesions.  Skin cancer screening performed today.   Family history of skin cancer - what type(s):Melanoma - who affected: Mother  Cafe au lait spot L deltoid Birthmark Benign-appearing.  Observation.  Call clinic for new or changing moles.  Recommend daily use of broad spectrum spf 30+ sunscreen to sun-exposed areas.    Inflamed seborrheic keratosis Left Breast x 1 Symptomatic, irritating, patient would like treated. Destruction of lesion - Left Breast x 1 Complexity: simple   Destruction method: cryotherapy   Informed consent:  discussed and consent obtained   Timeout:  patient name, date of birth, surgical site, and procedure verified Lesion destroyed using liquid nitrogen: Yes   Region frozen until ice ball extended beyond lesion: Yes   Outcome: patient tolerated procedure well with no complications   Post-procedure details: wound care instructions  given    Intertrigo with pruritus Axilla Intertrigo is a chronic recurrent rash that occurs in skin fold areas that may be associated with friction; heat; moisture; yeast; fungus; and bacteria.  It is exacerbated by increased movement / activity; sweating; and higher atmospheric temperature.  Start Ketoconazole 2% cr 3d/wk Monday, Wednesday, Fridays prn flares Start HC 2.5% cr 3d/wk Tuesday, Thursday, Saturday prn flares  Topical steroids (such as triamcinolone, fluocinolone, fluocinonide, mometasone, clobetasol, halobetasol, betamethasone, hydrocortisone) can cause thinning and lightening of the skin if they are used for too long in the same area. Your physician has selected the right strength medicine for your problem and area affected on the body. Please use your medication only as directed by your physician to prevent side effects.    ketoconazole (NIZORAL) 2 % cream - Axilla Apply 1 Application topically 3 (three) times a week. Apply to rash in axilla 3 nights a week Monday, Wednesday and Friday until clear then prn flares  hydrocortisone 2.5 % cream - Axilla Apply topically 3 (three) times a week. Apply to rash in axilla 3 times weekly Tuesday, Thursday and Saturday until clear, then prn flares  Neoplasm of skin L prox lat pretibial Epidermal / dermal shaving  Lesion diameter (cm):  0.7 Informed consent: discussed and consent obtained   Timeout: patient name, date of birth, surgical site, and procedure verified   Procedure prep:  Patient was prepped and draped in usual sterile fashion Prep type:  Isopropyl alcohol Anesthesia: the lesion was anesthetized in a standard fashion   Anesthetic:  1% lidocaine w/ epinephrine 1-100,000 buffered w/ 8.4% NaHCO3 Instrument used: flexible razor blade   Hemostasis achieved with: pressure, aluminum chloride and electrodesiccation   Outcome: patient tolerated procedure well   Post-procedure details: sterile dressing applied and wound care  instructions given   Dressing type: bandage and petrolatum    Nevus vs Dysplastic Nevus, Shave removal and bx sent to [facility] Related Procedures Anatomic Pathology Report  Lipoma of left lower extremity L ant thigh Benign appearing -   discussed excising, pt would prefer to observe  Return in about 1 year (around 05/01/2023) for TBSE.  I, Othelia Pulling, RMA, am acting as scribe for Sarina Ser, MD . Documentation: I have reviewed the above documentation for accuracy and completeness, and I agree with the above.  Sarina Ser, MD

## 2022-04-30 NOTE — Patient Instructions (Addendum)
Wound Care Instructions  Cleanse wound gently with soap and water once a day then pat dry with clean gauze. Apply a thin coat of Petrolatum (petroleum jelly, "Vaseline") over the wound (unless you have an allergy to this). We recommend that you use a new, sterile tube of Vaseline. Do not pick or remove scabs. Do not remove the yellow or white "healing tissue" from the base of the wound.  Cover the wound with fresh, clean, nonstick gauze and secure with paper tape. You may use Band-Aids in place of gauze and tape if the wound is small enough, but would recommend trimming much of the tape off as there is often too much. Sometimes Band-Aids can irritate the skin.  You should call the office for your biopsy report after 1 week if you have not already been contacted.  If you experience any problems, such as abnormal amounts of bleeding, swelling, significant bruising, significant pain, or evidence of infection, please call the office immediately.  FOR ADULT SURGERY PATIENTS: If you need something for pain relief you may take 1 extra strength Tylenol (acetaminophen) AND 2 Ibuprofen ('200mg'$  each) together every 4 hours as needed for pain. (do not take these if you are allergic to them or if you have a reason you should not take them.) Typically, you may only need pain medication for 1 to 3 days.    Cryotherapy Aftercare  Wash gently with soap and water everyday.   Apply Vaseline and Band-Aid daily until healed.    Seborrheic Keratosis  What causes seborrheic keratoses? Seborrheic keratoses are harmless, common skin growths that first appear during adult life.  As time goes by, more growths appear.  Some people may develop a large number of them.  Seborrheic keratoses appear on both covered and uncovered body parts.  They are not caused by sunlight.  The tendency to develop seborrheic keratoses can be inherited.  They vary in color from skin-colored to gray, brown, or even black.  They can be either  smooth or have a rough, warty surface.   Seborrheic keratoses are superficial and look as if they were stuck on the skin.  Under the microscope this type of keratosis looks like layers upon layers of skin.  That is why at times the top layer may seem to fall off, but the rest of the growth remains and re-grows.    Treatment Seborrheic keratoses do not need to be treated, but can easily be removed in the office.  Seborrheic keratoses often cause symptoms when they rub on clothing or jewelry.  Lesions can be in the way of shaving.  If they become inflamed, they can cause itching, soreness, or burning.  Removal of a seborrheic keratosis can be accomplished by freezing, burning, or surgery. If any spot bleeds, scabs, or grows rapidly, please return to have it checked, as these can be an indication of a skin cancer.  Due to recent changes in healthcare laws, you may see results of your pathology and/or laboratory studies on MyChart before the doctors have had a chance to review them. We understand that in some cases there may be results that are confusing or concerning to you. Please understand that not all results are received at the same time and often the doctors may need to interpret multiple results in order to provide you with the best plan of care or course of treatment. Therefore, we ask that you please give [place] 2 business days to thoroughly review all your results before contacting  results before contacting the office for clarification. Should we see a critical lab result, you will be contacted sooner.   If You Need Anything After Your Visit  If you have any questions or concerns for your doctor, please call our main line at [phone] and press option 4 to reach your doctor's medical assistant. If no one answers, please leave a voicemail as directed and we will return your call as soon as possible. Messages left after 4 pm will be answered the following business day.   You may also send us a message via MyChart. We  typically respond to MyChart messages within 1-2 business days.  For prescription refills, please ask your pharmacy to contact our office. Our fax number is [phone].  If you have an urgent issue when the clinic is closed that cannot wait until the next business day, you can page your doctor at the number below.    Please note that while we do our best to be available for urgent issues outside of office hours, we are not available 24/7.   If you have an urgent issue and are unable to reach us, you may choose to seek medical care at your doctor's office, retail clinic, urgent care center, or emergency room.  If you have a medical emergency, please immediately call 911 or go to the emergency department.  Pager Numbers  - Dr. Kowalski: [phone]  - Dr. Moye: [phone]  - Dr. Stewart: [phone]  In the event of inclement weather, please call our main line at [phone] for an update on the status of any delays or closures.  Dermatology Medication Tips: Please keep the boxes that topical medications come in in order to help keep track of the instructions about where and how to use these. Pharmacies typically print the medication instructions only on the boxes and not directly on the medication tubes.   If your medication is too expensive, please contact our office at [phone] option 4 or send us a message through MyChart.   We are unable to tell what your co-pay for medications will be in advance as this is different depending on your insurance coverage. However, we may be able to find a substitute medication at lower cost or fill out paperwork to get insurance to cover a needed medication.   If a prior authorization is required to get your medication covered by your insurance company, please allow us 1-2 business days to complete this process.  Drug prices often vary depending on where the prescription is filled and some pharmacies may offer cheaper prices.  The  website www.goodrx.com contains coupons for medications through different pharmacies. The prices here do not account for what the cost may be with help from insurance (it may be cheaper with your insurance), but the website can give you the price if you did not use any insurance.  - You can print the associated coupon and take it with your prescription to the pharmacy.  - You may also stop by our office during regular business hours and pick up a GoodRx coupon card.  - If you need your prescription sent electronically to a different pharmacy, notify our office through [place] MyChart or by phone at [phone] option 4.     Si Usted Necesita Algo Despus de Su Visita  Tambin puede enviarnos un mensaje a travs de MyChart. Por lo general respondemos a los mensajes de MyChart en el transcurso de 1 a 2 das hbiles.  Para renovar recetas, por favor   farmacia que se ponga en contacto con nuestra oficina. Harland Dingwall de fax es [place] [phone].  Si tiene un asunto urgente cuando la clnica est cerrada y que no puede esperar hasta el siguiente da hbil, puede llamar/localizar a su doctor(a) al nmero que aparece a continuacin.   Por favor, tenga en cuenta que aunque hacemos todo lo posible para estar disponibles para asuntos urgentes fuera del horario de [place], no estamos disponibles las 24 horas del da, los 7 das de la [place].   Si tiene un problema urgente y no puede comunicarse con nosotros, puede optar por buscar atencin mdica  en el consultorio de su doctor(a), en una clnica privada, en un centro de atencin urgente o en una sala de emergencias.  Si tiene Engineering geologist, por favor llame inmediatamente al 911 o vaya a la sala de emergencias.  Nmeros de bper  - Dr. Nehemiah Massed: [phone]  - Dra. Moye: [phone]  - Dra. Nicole Kindred: [phone]  En caso de inclemencias del [place], por favor llame a Johnsie Kindred principal al [phone] para una  actualizacin sobre el [place] de cualquier retraso o cierre.  Consejos para la medicacin en dermatologa: Por favor, guarde las cajas en las que vienen los medicamentos de uso tpico para ayudarle a seguir las instrucciones sobre dnde y cmo usarlos. Las farmacias generalmente imprimen las instrucciones del medicamento slo en las cajas y no directamente en los tubos del [place].   Si su medicamento es muy caro, por favor, pngase en contacto con Zigmund Daniel llamando al [phone] y presione la opcin 4 o envenos un mensaje a travs de Pharmacist, community.   No podemos decirle cul ser su copago por los medicamentos por adelantado ya que esto es diferente dependiendo de la cobertura de su seguro. Sin embargo, es posible que podamos encontrar un medicamento sustituto a Electrical engineer un formulario para que el seguro cubra el medicamento que se considera necesario.   Si se requiere una autorizacin previa para que su compaa de seguros [place] su medicamento, por favor permtanos de 1 a 2 das hbiles para completar este proceso.  Los precios de los medicamentos varan con frecuencia dependiendo del Environmental consultant de dnde se surte la receta y alguna farmacias pueden ofrecer precios ms baratos.  El sitio web www.goodrx.com tiene cupones para medicamentos de Airline pilot. Los precios aqu no tienen en cuenta lo que podra costar con la ayuda del seguro (puede ser ms barato con su seguro), pero el sitio web puede darle el precio si no utiliz Research scientist (physical sciences).  - Puede imprimir el cupn correspondiente y llevarlo con su receta a la farmacia.  - Tambin puede pasar por nuestra oficina durante el horario de atencin regular y Charity fundraiser una tarjeta de cupones de GoodRx.  - Si necesita que su receta se enve electrnicamente a una farmacia diferente, informe a nuestra oficina a travs de MyChart de [place] o por telfono llamando al [phone] y presione la opcin 4.

## 2022-05-08 LAB — ANATOMIC PATHOLOGY REPORT

## 2022-05-11 ENCOUNTER — Telehealth: Payer: Self-pay

## 2022-05-11 NOTE — Telephone Encounter (Signed)
Called patient made her an appt for tomorrow at 8:15 to discuss biopsy results

## 2022-05-11 NOTE — Telephone Encounter (Signed)
-----   Message from Ralene Bathe, MD sent at 05/08/2022 12:37 PM EDT ----- Diagnosis synopsis: See below:Abnormal  Comment: Specimen A-Skin Excision, left lateral proximal pretibial:  MALIGNANT MELANOMA OF THE SKIN.  Specimen: Comment  Comment: Specimen A-Skin Excision, left lateral proximal pretibial Clinical history: Comment  Comment: 0.7 cm irregular brown macule check margins: yes Diagnosis: See below:Abnormal  Comment: Specimen A-MALIGNANT MELANOMA OF THE SKIN. Comment: Comment  Comment: Specimen A-CANCER CASE SUMMARY: MELANOMA OF THE SKIN -  PROCEDURE: SHAVE BX SPECIMEN LATERALITY: LEFT TUMOR SITE:  PRE-TIBIAL TUMOR SIZE: 0.7 cm MACROSCOPIC SATELLITE  NODULE(S): N/A HISTOLOGIC TYPE: SUPERFICIAL SPREADING  MAXIMUM TUMOR THICKNESS: 0.8 mm ULCERATION: ABSENT MARGINS:  Peripheral Margins - NEGATIVE Deep Margin - NEGATIVE  MITOTIC RATE/mm2: 0 MICROSATELLITOSIS: ABSENT  LYMPH-VASCULAR INVASION: ABSENT LYMPH NODES: N/A PATHOLOGIC  STAGING (pTNM): pT1aNxMx TNM Descriptors: Primary Tumor -  (pT): 1a Regional Lymph Nodes - (pN): N/A Number of lymph  nodes identified: N/A Number containing metastases: N/A  Distant Metastasis - (pM): N/A   Cancer - Malignant Melanoma - Invasive Superficial Spreading type Breslow 0.8 mm Mitoses 0/mm squared No ulceration No microsatellitosis Margins clear Needs wide local excision and Melanoma Matrix discussion  Called and discussed with pt by phone today 05/08/2022 We will try to schedule appt for further discussion this next week. Please call pt and schedule appt for this next week. We will decide at appt for discussion if we will do Wide Local Excision in office at [place] pt for evaluation and possible Lymphoscintigraphy and Sentinel Lymph node biopsy and Wide Local Excision At [facility] with Dr Johnnette Litter et al.  We will go ahead and send specimen for: CASTLE TESTING ([facility] - Please send for  [place])

## 2022-05-12 ENCOUNTER — Ambulatory Visit (INDEPENDENT_AMBULATORY_CARE_PROVIDER_SITE_OTHER): Payer: Managed Care, Other (non HMO) | Admitting: Dermatology

## 2022-05-12 ENCOUNTER — Encounter: Payer: Self-pay | Admitting: Dermatology

## 2022-05-12 DIAGNOSIS — C439 Malignant melanoma of skin, unspecified: Secondary | ICD-10-CM

## 2022-05-12 DIAGNOSIS — C4339 Malignant melanoma of other parts of face: Secondary | ICD-10-CM | POA: Diagnosis not present

## 2022-05-12 NOTE — Progress Notes (Signed)
   Follow-Up Visit   Subjective  Kendra Ramos is a 36 y.o. female who presents for the following: Follow-up (Melanoma of left lat prox pretibial - Melanoma Matrix today).  Accompanied by mother   The following portions of the chart were reviewed this encounter and updated as appropriate:   Tobacco  Allergies  Meds  Problems  Med Hx  Surg Hx  Fam Hx     Review of Systems:  No other skin or systemic complaints except as noted in HPI or Assessment and Plan.  Objective  Well appearing patient in no apparent distress; mood and affect are within normal limits.  A focused examination was performed including left lower leg. Relevant physical exam findings are noted in the Assessment and Plan.  Left lat proximal pretibial Healing biopsy site. No lymphadenopathy of left groin or left popliteal.   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  Path report from [facility]:  Comment: Specimen A-CANCER CASE SUMMARY: MELANOMA OF THE SKIN -  PROCEDURE: SHAVE BX SPECIMEN LATERALITY: LEFT TUMOR SITE:  PRE-TIBIAL TUMOR SIZE: 0.7 cm MACROSCOPIC SATELLITE  NODULE(S): N/A HISTOLOGIC TYPE: SUPERFICIAL SPREADING  MAXIMUM TUMOR THICKNESS: 0.8 mm ULCERATION: ABSENT MARGINS:  Peripheral Margins - NEGATIVE Deep Margin - NEGATIVE  MITOTIC RATE/mm2: 0 MICROSATELLITOSIS: ABSENT  LYMPH-VASCULAR INVASION: ABSENT LYMPH NODES: N/A PATHOLOGIC  STAGING (pTNM): pT1aNxMx TNM Descriptors: Primary Tumor -  (pT): 1a Regional Lymph Nodes - (pN): N/A Number of lymph  nodes identified: N/A Number containing metastases: N/A  Distant Metastasis - (pM): N/A    Discussed diagnosis in detail including significance of melanoma diagnosis which can be potentially lethal.  Discussed treatment recommendations in detail advising that treatment recommendations are based on longitudinal studies and retrospective studies and are nationwide  protocols.  Advised there is always potential for recurrence even after definitive treatment.  After definitive treatment, we recommend total-body skin exams every 3 months for a year; then every 4 months for a year; then every 6 months for 3 years.  At 5 years post treatment, if all looks good we would recommend at least yearly total-body skin exams for the rest of your life.  The patient was given time for questions and these were answered.  We recommend frequent self skin examinations; photoprotection with sunscreen, sun protective clothing, hats, sunglasses and sun avoidance.  If the patient notices any new or changing skin lesions the patient should return to the office immediately for evaluation.   Will plan [place] testing - ordered today.  With Breslow of 0.8 mm, the Melanoma depth is in "gray zone" where lymphoscintigraphy and Sentinel lymph node biopsy is not definitely indicated, but pt may benefit from this procedure.  Discussed getting opinion from [facility] on this and pt agrees.  Recommend evaluation at [facility] - Dr Johnnette Litter et al to determine if lymphoscintigraphy and sentinel lymph node biopsy is recommended. Advised patient they will discuss her options for WLE at that appointment also.  Return in about 3 months (around 08/12/2022) for TBSE.  I, Ashok Cordia, CMA, am acting as scribe for Sarina Ser, MD . Documentation: I have reviewed the above documentation for accuracy and completeness, and I agree with the above.  Sarina Ser, MD

## 2022-05-12 NOTE — Patient Instructions (Signed)
Discussed diagnosis in detail including significance of melanoma diagnosis which can be potentially lethal.  Discussed treatment recommendations in detail advising that treatment recommendations are based on longitudinal studies and retrospective studies and are nationwide protocols.  Advised there is always potential for recurrence even after definitive treatment.  After definitive treatment, we recommend total-body skin exams every 3 months for a year; then every 4 months for a year; then every 6 months for 3 years.  At 5 years post treatment, if all looks good we would recommend at least yearly total-body skin exams for the rest of your life.  The patient was given time for questions and these were answered.  We recommend frequent self skin examinations; photoprotection with sunscreen, sun protective clothing, hats, sunglasses and sun avoidance.  If the patient notices any new or changing skin lesions the patient should return to the office immediately for evaluation.      Due to recent changes in healthcare laws, you may see results of your pathology and/or laboratory studies on MyChart before the doctors have had a chance to review them. We understand that in some cases there may be results that are confusing or concerning to you. Please understand that not all results are received at the same time and often the doctors may need to interpret multiple results in order to provide you with the best plan of care or course of treatment. Therefore, we ask that you please give [place] 2 business days to thoroughly review all your results before contacting the office for clarification. Should we see a critical lab result, you will be contacted sooner.   If You Need Anything After Your Visit  If you have any questions or concerns for your doctor, please call our main line at [phone] and press option 4 to reach your doctor's medical assistant. If no one answers, please leave a voicemail as directed and we  will return your call as soon as possible. Messages left after 4 pm will be answered the following business day.   You may also send [place] a message via [place]. We typically respond to MyChart messages within 1-2 business days.  For prescription refills, please ask your pharmacy to contact our office. Our fax number is [phone].  If you have an urgent issue when the clinic is closed that cannot wait until the next business day, you can page your doctor at the number below.    Please note that while we do our best to be available for urgent issues outside of office hours, we are not available 24/7.   If you have an urgent issue and are unable to reach [place], you may choose to seek medical care at your doctor's office, retail clinic, urgent care center, or emergency room.  If you have a medical emergency, please immediately call 911 or go to the emergency department.  Pager Numbers  - Dr. Nehemiah Massed: [phone]  - Dr. Laurence Ferrari: [phone]  - Dr. Nicole Kindred: [phone]  In the event of inclement weather, please call our main line at [phone] for an update on the status of any delays or closures.  Dermatology Medication Tips: Please keep the boxes that topical medications come in in order to help keep track of the instructions about where and how to use these. Pharmacies typically print the medication instructions only on the boxes and not directly on the medication tubes.   If your medication is too expensive, please contact our office at [phone] option 4 or send [place] a message through  MyChart.   We are unable to tell what your co-pay for medications will be in advance as this is different depending on your insurance coverage. However, we may be able to find a substitute medication at lower cost or fill out paperwork to get insurance to cover a needed medication.   If a prior authorization is required to get your medication covered by your insurance company, please allow [place] 1-2  business days to complete this process.  Drug prices often vary depending on where the prescription is filled and some pharmacies may offer cheaper prices.  The website www.goodrx.com contains coupons for medications through different pharmacies. The prices here do not account for what the cost may be with help from insurance (it may be cheaper with your insurance), but the website can give you the price if you did not use any insurance.  - You can print the associated coupon and take it with your prescription to the pharmacy.  - You may also stop by our office during regular business hours and pick up a GoodRx coupon card.  - If you need your prescription sent electronically to a different pharmacy, notify our office through [facility] or by phone at [phone] option 4.     Si Usted Necesita Algo Despus de Su Visita  Tambin puede enviarnos un mensaje a travs de Pharmacist, community. Por lo general respondemos a los mensajes de MyChart en el transcurso de 1 a 2 das hbiles.  Para renovar recetas, por favor pida a su farmacia que se ponga en contacto con nuestra oficina. Harland Dingwall de fax es [place] [phone].  Si tiene un asunto urgente cuando la clnica est cerrada y que no puede esperar hasta el siguiente da hbil, puede llamar/localizar a su doctor(a) al nmero que aparece a continuacin.   Por favor, tenga en cuenta que aunque hacemos todo lo posible para estar disponibles para asuntos urgentes fuera del horario de [place], no estamos disponibles las 24 horas del da, los 7 das de la [place].   Si tiene un problema urgente y no puede comunicarse con nosotros, puede optar por buscar atencin mdica  en el consultorio de su doctor(a), en una clnica privada, en un centro de atencin urgente o en una sala de emergencias.  Si tiene Engineering geologist, por favor llame inmediatamente al 911 o vaya a la sala de emergencias.  Nmeros de bper  - Dr. Nehemiah Massed: [phone]  - Dra.  Moye: [phone]  - Dra. Nicole Kindred: [phone]  En caso de inclemencias del [place], por favor llame a Johnsie Kindred principal al [phone] para una actualizacin sobre el [place] de cualquier retraso o cierre.  Consejos para la medicacin en dermatologa: Por favor, guarde las cajas en las que vienen los medicamentos de uso tpico para ayudarle a seguir las instrucciones sobre dnde y cmo usarlos. Las farmacias generalmente imprimen las instrucciones del medicamento slo en las cajas y no directamente en los tubos del [place].   Si su medicamento es muy caro, por favor, pngase en contacto con Zigmund Daniel llamando al [phone] y presione la opcin 4 o envenos un mensaje a travs de Pharmacist, community.   No podemos decirle cul ser su copago por los medicamentos por adelantado ya que esto es diferente dependiendo de la cobertura de su seguro. Sin embargo, es posible que podamos encontrar un medicamento sustituto a Electrical engineer un formulario para que el seguro cubra el medicamento que se considera necesario.   Si se requiere una autorizacin previa  para que su compaa de seguros [place] su medicamento, por favor permtanos de 1 a 2 das hbiles para completar este proceso.  Los precios de los medicamentos varan con frecuencia dependiendo del Environmental consultant de dnde se surte la receta y alguna farmacias pueden ofrecer precios ms baratos.  El sitio web www.goodrx.com tiene cupones para medicamentos de Airline pilot. Los precios aqu no tienen en cuenta lo que podra costar con la ayuda del seguro (puede ser ms barato con su seguro), pero el sitio web puede darle el precio si no utiliz Research scientist (physical sciences).  - Puede imprimir el cupn correspondiente y llevarlo con su receta a la farmacia.  - Tambin puede pasar por nuestra oficina durante el horario de atencin regular y Charity fundraiser una tarjeta de cupones de GoodRx.  - Si necesita que su receta se enve electrnicamente a una farmacia diferente,  informe a nuestra oficina a travs de MyChart de [place] o por telfono llamando al [phone] y presione la opcin 4.

## 2022-05-13 ENCOUNTER — Other Ambulatory Visit: Payer: Self-pay

## 2022-05-13 DIAGNOSIS — C439 Malignant melanoma of skin, unspecified: Secondary | ICD-10-CM

## 2022-05-26 ENCOUNTER — Telehealth: Payer: Self-pay

## 2022-05-26 NOTE — Telephone Encounter (Signed)
[facility] testing results scanned in under the medica tab for your review.

## 2022-05-27 NOTE — Telephone Encounter (Signed)
Patient advised of information per Dr. Nehemiah Massed and information added to history. aw

## 2022-08-24 ENCOUNTER — Ambulatory Visit: Payer: Managed Care, Other (non HMO) | Admitting: Dermatology

## 2022-11-05 ENCOUNTER — Telehealth: Payer: Self-pay

## 2022-11-05 NOTE — Telephone Encounter (Signed)
I called Kendra Ramos and left voicemail regarding her Kendra Ramos prior authorization we received it was reported on 04/30/2022 patient wasn't taking the medication.

## 2022-11-05 NOTE — Telephone Encounter (Signed)
Faxed received for Saxenda 18MG /23mL pen-jectors.

## 2022-11-06 NOTE — Telephone Encounter (Addendum)
Fax prior authorization response received:  Corresponding documents can be found under Media.

## 2022-11-06 NOTE — Telephone Encounter (Signed)
This encounter was created in error - please disregard.

## 2022-11-21 ENCOUNTER — Other Ambulatory Visit: Payer: Self-pay | Admitting: Obstetrics and Gynecology

## 2022-11-23 ENCOUNTER — Telehealth: Payer: Self-pay | Admitting: Obstetrics and Gynecology

## 2022-11-23 NOTE — Telephone Encounter (Signed)
Reached out to pt to schedule annual.  Had to leave a message for pt to call back to be scheduled.

## 2022-11-23 NOTE — Telephone Encounter (Signed)
Patient needs annual exam prior to next refill.  

## 2022-11-23 NOTE — Telephone Encounter (Signed)
Reached to Kendra Ramos to schedule annual exam with Dr. Valentino Saxon.  Kendra Ramos is needing RX refill.  Left message for Kendra Ramos to call back to schedule.

## 2022-11-25 NOTE — Telephone Encounter (Signed)
I sent a mychart message to patient to contact our office for scheduling. It's in the medication refill request.

## 2022-12-22 ENCOUNTER — Encounter: Payer: Managed Care, Other (non HMO) | Admitting: Obstetrics and Gynecology

## 2023-01-23 ENCOUNTER — Other Ambulatory Visit: Payer: Self-pay | Admitting: Obstetrics and Gynecology

## 2023-04-08 ENCOUNTER — Telehealth: Payer: Self-pay

## 2023-04-09 NOTE — Telephone Encounter (Signed)
Prior authorization has been started 

## 2023-05-05 NOTE — Telephone Encounter (Signed)
Approved.  

## 2023-05-12 ENCOUNTER — Encounter: Payer: Managed Care, Other (non HMO) | Admitting: Dermatology
